# Patient Record
Sex: Female | Born: 1965 | Race: Black or African American | Hispanic: No | Smoking: Current every day smoker
Health system: Southern US, Community
[De-identification: ages and names within clinical notes are randomized; demographics above are authoritative.]

## PROBLEM LIST (undated history)

## (undated) DIAGNOSIS — I10 Essential (primary) hypertension: Secondary | ICD-10-CM

## (undated) DIAGNOSIS — M1990 Unspecified osteoarthritis, unspecified site: Secondary | ICD-10-CM

## (undated) DIAGNOSIS — E119 Type 2 diabetes mellitus without complications: Secondary | ICD-10-CM

## (undated) DIAGNOSIS — E785 Hyperlipidemia, unspecified: Secondary | ICD-10-CM

## (undated) HISTORY — DX: Hyperlipidemia, unspecified: E78.5

## (undated) HISTORY — DX: Type 2 diabetes mellitus without complications: E11.9

## (undated) HISTORY — DX: Unspecified osteoarthritis, unspecified site: M19.90

---

## 2006-06-16 ENCOUNTER — Emergency Department: Payer: Self-pay | Admitting: General Practice

## 2006-12-15 ENCOUNTER — Ambulatory Visit: Payer: Self-pay | Admitting: General Practice

## 2006-12-28 ENCOUNTER — Ambulatory Visit: Payer: Self-pay | Admitting: General Practice

## 2008-12-23 ENCOUNTER — Emergency Department: Payer: Self-pay | Admitting: Emergency Medicine

## 2011-10-22 ENCOUNTER — Emergency Department: Payer: Self-pay | Admitting: Emergency Medicine

## 2011-10-22 LAB — BASIC METABOLIC PANEL
Anion Gap: 9 (ref 7–16)
Calcium, Total: 9.6 mg/dL (ref 8.5–10.1)
Co2: 26 mmol/L (ref 21–32)
Creatinine: 1.1 mg/dL (ref 0.60–1.30)
EGFR (African American): >60
EGFR (Non-African Amer.): >60
Glucose: 108 mg/dL — ABNORMAL HIGH (ref 65–99)
Sodium: 140 mmol/L (ref 136–145)

## 2011-10-22 LAB — CBC
MCH: 29.3 pg (ref 26.0–34.0)
MCV: 87 fL (ref 80–100)
Platelet: 290 10*3/uL (ref 150–440)
RBC: 4.57 10*6/uL (ref 3.80–5.20)
RDW: 14.8 % — ABNORMAL HIGH (ref 11.5–14.5)
WBC: 7.3 10*3/uL (ref 3.6–11.0)

## 2011-10-22 LAB — URINALYSIS, COMPLETE
Bacteria: NONE SEEN
Specific Gravity: 1.022 (ref 1.003–1.030)
Squamous Epithelial: 11
WBC UR: 6 /HPF (ref 0–5)

## 2011-10-22 LAB — PREGNANCY, URINE: Pregnancy Test, Urine: NEGATIVE m[IU]/mL

## 2012-08-15 ENCOUNTER — Emergency Department: Payer: Self-pay | Admitting: Emergency Medicine

## 2012-08-15 LAB — BASIC METABOLIC PANEL
Anion Gap: 5 — ABNORMAL LOW (ref 7–16)
BUN: 12 mg/dL (ref 7–18)
Chloride: 102 mmol/L (ref 98–107)
Co2: 31 mmol/L (ref 21–32)
Creatinine: 0.95 mg/dL (ref 0.60–1.30)
EGFR (Non-African Amer.): >60

## 2012-08-15 LAB — CBC
HCT: 36.2 % (ref 35.0–47.0)
HGB: 12.1 g/dL (ref 12.0–16.0)
MCH: 28.4 pg (ref 26.0–34.0)
MCHC: 33.5 g/dL (ref 32.0–36.0)
MCV: 85 fL (ref 80–100)
Platelet: 297 10*3/uL (ref 150–440)
RBC: 4.27 10*6/uL (ref 3.80–5.20)
WBC: 10.3 10*3/uL (ref 3.6–11.0)

## 2012-08-15 LAB — TROPONIN I: Troponin-I: <0.02 ng/mL

## 2012-08-15 LAB — PRO B NATRIURETIC PEPTIDE: B-Type Natriuretic Peptide: 61 pg/mL (ref 0–125)

## 2013-08-27 ENCOUNTER — Ambulatory Visit: Payer: Self-pay | Admitting: Family Medicine

## 2016-12-03 ENCOUNTER — Emergency Department: Payer: Self-pay

## 2016-12-03 ENCOUNTER — Emergency Department
Admission: EM | Admit: 2016-12-03 | Discharge: 2016-12-03 | Disposition: A | Discharge status: Home / Self Care | Payer: Self-pay | Attending: Emergency Medicine | Admitting: Emergency Medicine

## 2016-12-03 DIAGNOSIS — J181 Lobar pneumonia, unspecified organism: Secondary | ICD-10-CM | POA: Insufficient documentation

## 2016-12-03 DIAGNOSIS — N390 Urinary tract infection, site not specified: Secondary | ICD-10-CM | POA: Insufficient documentation

## 2016-12-03 DIAGNOSIS — R319 Hematuria, unspecified: Secondary | ICD-10-CM | POA: Insufficient documentation

## 2016-12-03 DIAGNOSIS — F17210 Nicotine dependence, cigarettes, uncomplicated: Secondary | ICD-10-CM | POA: Insufficient documentation

## 2016-12-03 DIAGNOSIS — I10 Essential (primary) hypertension: Secondary | ICD-10-CM | POA: Insufficient documentation

## 2016-12-03 DIAGNOSIS — J189 Pneumonia, unspecified organism: Secondary | ICD-10-CM

## 2016-12-03 DIAGNOSIS — E876 Hypokalemia: Secondary | ICD-10-CM | POA: Insufficient documentation

## 2016-12-03 HISTORY — DX: Essential (primary) hypertension: I10

## 2016-12-03 LAB — COMPREHENSIVE METABOLIC PANEL
ALBUMIN: 2.9 g/dL — AB (ref 3.5–5.0)
ALT: 19 U/L (ref 14–54)
AST: 30 U/L (ref 15–41)
Alkaline Phosphatase: 137 U/L — ABNORMAL HIGH (ref 38–126)
Anion gap: 12 (ref 5–15)
BUN: 9 mg/dL (ref 6–20)
CHLORIDE: 96 mmol/L — AB (ref 101–111)
CO2: 23 mmol/L (ref 22–32)
Calcium: 8.6 mg/dL — ABNORMAL LOW (ref 8.9–10.3)
Creatinine, Ser: 1.07 mg/dL — ABNORMAL HIGH (ref 0.44–1.00)
GFR calc non Af Amer: >60 mL/min (ref >60)
GLUCOSE: 116 mg/dL — AB (ref 65–99)
Potassium: 2.7 mmol/L — CL (ref 3.5–5.1)
SODIUM: 131 mmol/L — AB (ref 135–145)
Total Bilirubin: 0.4 mg/dL (ref 0.3–1.2)
Total Protein: 7.2 g/dL (ref 6.5–8.1)

## 2016-12-03 LAB — CBC WITH DIFFERENTIAL/PLATELET
BASOS PCT: 1 %
Basophils Absolute: 0.1 10*3/uL (ref 0–0.1)
EOS ABS: 0 10*3/uL (ref 0–0.7)
Eosinophils Relative: 0 %
HEMATOCRIT: 31.7 % — AB (ref 35.0–47.0)
HEMOGLOBIN: 11 g/dL — AB (ref 12.0–16.0)
Lymphocytes Relative: 12 %
Lymphs Abs: 1.5 10*3/uL (ref 1.0–3.6)
MCH: 28.5 pg (ref 26.0–34.0)
MCHC: 34.8 g/dL (ref 32.0–36.0)
MCV: 82 fL (ref 80.0–100.0)
Monocytes Absolute: 0.6 10*3/uL (ref 0.2–0.9)
Monocytes Relative: 4 %
NEUTROS ABS: 10.5 10*3/uL — AB (ref 1.4–6.5)
NEUTROS PCT: 83 %
Platelets: 267 10*3/uL (ref 150–440)
RBC: 3.86 MIL/uL (ref 3.80–5.20)
RDW: 15.6 % — ABNORMAL HIGH (ref 11.5–14.5)
WBC: 12.7 10*3/uL — AB (ref 3.6–11.0)

## 2016-12-03 LAB — URINALYSIS, COMPLETE (UACMP) WITH MICROSCOPIC
BILIRUBIN URINE: NEGATIVE
Glucose, UA: NEGATIVE mg/dL
KETONES UR: NEGATIVE mg/dL
NITRITE: NEGATIVE
PROTEIN: 100 mg/dL — AB
Specific Gravity, Urine: 1.019 (ref 1.005–1.030)
pH: 5 (ref 5.0–8.0)

## 2016-12-03 LAB — LACTIC ACID, PLASMA: LACTIC ACID, VENOUS: 1.4 mmol/L (ref 0.5–1.9)

## 2016-12-03 LAB — LIPASE, BLOOD: Lipase: 25 U/L (ref 11–51)

## 2016-12-03 LAB — PREGNANCY, URINE: Preg Test, Ur: NEGATIVE

## 2016-12-03 MED ORDER — ACETAMINOPHEN 500 MG PO TABS
1000.0000 mg | ORAL_TABLET | Freq: Once | ORAL | Status: AC
  Administered 2016-12-03: 1000 mg via ORAL
  Filled 2016-12-03: qty 2

## 2016-12-03 MED ORDER — DEXTROSE 5 % IV SOLN
500.0000 mg | Freq: Once | INTRAVENOUS | Status: AC
  Administered 2016-12-03: 500 mg via INTRAVENOUS
  Filled 2016-12-03: qty 500

## 2016-12-03 MED ORDER — DEXTROSE 5 % IV SOLN
1.0000 g | Freq: Once | INTRAVENOUS | Status: AC
  Administered 2016-12-03: 1 g via INTRAVENOUS
  Filled 2016-12-03: qty 10

## 2016-12-03 MED ORDER — CEPHALEXIN 500 MG PO CAPS: 500.0000 mg | ORAL_CAPSULE | Freq: Three times a day (TID) | ORAL | 0 refills | Status: AC

## 2016-12-03 MED ORDER — SODIUM CHLORIDE 0.9 % IV BOLUS (SEPSIS)
1000.0000 mL | Freq: Once | INTRAVENOUS | Status: AC
  Administered 2016-12-03: 1000 mL via INTRAVENOUS

## 2016-12-03 MED ORDER — DOXYCYCLINE MONOHYDRATE 100 MG PO CAPS: 100.0000 mg | ORAL_CAPSULE | Freq: Two times a day (BID) | ORAL | 0 refills | Status: AC

## 2016-12-03 MED ORDER — POTASSIUM CHLORIDE CRYS ER 20 MEQ PO TBCR: 20.0000 meq | EXTENDED_RELEASE_TABLET | Freq: Two times a day (BID) | ORAL | 0 refills | Status: DC

## 2016-12-03 MED ORDER — POTASSIUM CHLORIDE 10 MEQ/100ML IV SOLN
10.0000 meq | Freq: Once | INTRAVENOUS | Status: AC
  Administered 2016-12-03: 10 meq via INTRAVENOUS
  Filled 2016-12-03: qty 100

## 2016-12-03 MED ORDER — POTASSIUM CHLORIDE 20 MEQ PO PACK
40.0000 meq | PACK | ORAL | Status: AC
  Administered 2016-12-03: 40 meq via ORAL
  Filled 2016-12-03: qty 2

## 2016-12-03 NOTE — Discharge Instructions (Signed)
as we discussed, it appears that your fever is due to a pneumonia as well as possibly a urinary tract infection.  I provided prescriptions for 2 different antibiotics.  Please take the full course of both medications and do not stop taking them early, even if you are feeling better.  Additionally I provided you with a prescription for a potassium supplement because your potassium level was low today.  Read through the included information, drink plenty of fluids, and return to the emergency department if he develop new or worsening symptoms.

## 2016-12-03 NOTE — ED Triage Notes (Signed)
Pt came to ED via EMS from home c/o headache for 1 wee Fever of 1031 on arrival Pt is hypertensive HR 107

## 2016-12-03 NOTE — ED Provider Notes (Signed)
[facility] Emergency Department Provider Note  ____________________________________________   First MD Initiated Contact with Patient 12/03/16 1306     (approximate)  I have reviewed the triage vital signs and the nursing notes.   HISTORY  Chief Complaint Headache and Fever    HPI Vanessa Wyatt is a 51 y.o. female with a history only of hypertension and obesity who presents for evaluation of fever and headache.  She states that for about a week she has had a cough, runny nose, and other respiratory/viral symptoms.  She states it got worse yesterday evening and became severe.  Starting this morning she was having fever and chills with worsening cough.  She denies chest pain except some pain with the cough.  She denies shortness of breath, nausea, vomiting, abdominal pain.  Nothing in particular makes the patient's symptoms better nor worse.    She denies neck pain and neck stiffness and has not had any diarrhea or constipation.   Past Medical History:  Diagnosis Date  . Hypertension     There are no active problems to display for this patient.   History reviewed. No pertinent surgical history.  Prior to Admission medications   Medication Sig Start Date End Date Taking? Authorizing Provider  cephALEXin (KEFLEX) 500 MG capsule Take 1 capsule (500 mg total) by mouth 3 (three) times daily. 12/03/16 12/13/16  Loleta RoseForbach, Janae Bonser, MD  doxycycline (MONODOX) 100 MG capsule Take 1 capsule (100 mg total) by mouth 2 (two) times daily. 12/03/16 12/13/16  Loleta RoseForbach, Lakisha Peyser, MD  potassium chloride SA (KLOR-CON M20) 20 MEQ tablet Take 1 tablet (20 mEq total) by mouth 2 (two) times daily. 12/03/16   Loleta RoseForbach, Dryden Tapley, MD    Allergies Patient has no known allergies.  No family history on file.  Social History Social History  Substance Use Topics  . Smoking status: Current Every Day Smoker    Types: Cigarettes  . Smokeless tobacco: Never Used  . Alcohol use No    Review of  Systems Constitutional: No fever/chills Eyes: No visual changes. ENT: No sore throat. Cardiovascular: Denies chest pain. Respiratory: Denies shortness of breath. Gastrointestinal: No abdominal pain.  No nausea, no vomiting.  No diarrhea.  No constipation. Genitourinary: Negative for dysuria. Musculoskeletal: Negative for neck pain.  Negative for back pain. Integumentary: Negative for rash. Neurological: Negative for headaches, focal weakness or numbness.   ____________________________________________   PHYSICAL EXAM:  VITAL SIGNS: ED Triage Vitals  Enc Vitals Group     BP 12/03/16 1231 (!) 178/101     Pulse Rate 12/03/16 1231 (!) 110     Resp 12/03/16 1231 (!) 22     Temp 12/03/16 1231 (!) 103 F (39.4 C)     Temp Source 12/03/16 1231 Oral     SpO2 12/03/16 1231 94 %     Weight 12/03/16 1232 98.4 kg (217 lb)     Height 12/03/16 1232 1.524 m (5')     Head Circumference --      Peak Flow --      Pain Score --      Pain Loc --      Pain Edu? --      Excl. in GC? --     Constitutional: Alert and oriented. appears uncomfortable but nontoxic Eyes: Conjunctivae are normal.  Head: Atraumatic. Nose: No congestion/rhinnorhea. Mouth/Throat: Mucous membranes are moist. Neck: No stridor.  No meningeal signs.   Cardiovascular: mild tachycardia, regular rhythm. Good peripheral circulation. Grossly normal heart sounds.  Respiratory: Normal respiratory effort.  No retractions. scattered crackles in right upper lobe Gastrointestinal: Obese. Soft and nontender. No distention.  Musculoskeletal: No lower extremity tenderness nor edema. No gross deformities of extremities. Neurologic:  Normal speech and language. No gross focal neurologic deficits are appreciated.  Skin:  Skin is warm, dry and intact. No rash noted. Psychiatric: Mood and affect are normal. Speech and behavior are normal.  ____________________________________________   LABS (all labs ordered are listed, but only  abnormal results are displayed)  Labs Reviewed  CBC WITH DIFFERENTIAL/PLATELET - Abnormal; Notable for the following:       Result Value   WBC 12.7 (*)    Hemoglobin 11.0 (*)    HCT 31.7 (*)    RDW 15.6 (*)    Neutro Abs 10.5 (*)    All other components within normal limits  URINALYSIS, COMPLETE (UACMP) WITH MICROSCOPIC - Abnormal; Notable for the following:    Color, Urine AMBER (*)    APPearance CLOUDY (*)    Hgb urine dipstick LARGE (*)    Protein, ur 100 (*)    Leukocytes, UA TRACE (*)    Bacteria, UA MANY (*)    Squamous Epithelial / LPF TOO NUMEROUS TO COUNT (*)    All other components within normal limits  COMPREHENSIVE METABOLIC PANEL - Abnormal; Notable for the following:    Sodium 131 (*)    Potassium 2.7 (*)    Chloride 96 (*)    Glucose, Bld 116 (*)    Creatinine, Ser 1.07 (*)    Calcium 8.6 (*)    Albumin 2.9 (*)    Alkaline Phosphatase 137 (*)    All other components within normal limits  CULTURE, BLOOD (ROUTINE X 2)  CULTURE, BLOOD (ROUTINE X 2)  URINE CULTURE  LACTIC ACID, PLASMA  LIPASE, BLOOD  PREGNANCY, URINE   ____________________________________________  EKG  ED ECG REPORT I, Cordell Coke, the attending physician, personally viewed and interpreted this ECG.  Date: 12/03/2016 EKG Time: 12:40 Rate: 107 Rhythm: mild sinus tachycardia QRS Axis: normal Intervals: normal ST/T Wave abnormalities: Non-specific ST segment / T-wave changes, but no evidence of acute ischemia. Narrative Interpretation: no evidence of acute ischemia   ED ECG REPORT I, Licet Dunphy, the attending physician, personally viewed and interpreted this ECG.  Date: 12/03/2016 EKG Time: 12:43 PM Rate: 108 Rhythm: Mild sinus tachycardia QRS Axis: normal Intervals: normal ST/T Wave abnormalities: Non-specific ST segment / T-wave changes, but no evidence of acute ischemia.\ Narrative Interpretation: no evidence of acute  ischemia    ____________________________________________  RADIOLOGY   Dg Chest Port 1 View  Result Date: 12/03/2016 CLINICAL DATA:  Sepsis EXAM: PORTABLE CHEST 1 VIEW COMPARISON:  08/15/2012 FINDINGS: Right upper lobe infiltrate with air bronchograms consistent with pneumonia. Left lung is clear. Negative for effusion or heart failure IMPRESSION: Right upper lobe infiltrate compatible with pneumonia Electronically Signed   By: Marlan Palau M.D.   On: 12/03/2016 13:04    ____________________________________________   PROCEDURES  Critical Care performed: No   Procedure(s) performed:   Procedures   ____________________________________________   INITIAL IMPRESSION / ASSESSMENT AND PLAN / ED COURSE  Pertinent labs & imaging results that were available during my care of the patient were reviewed by me and considered in my medical decision making (see chart for details).  although technically the patient does meet criteria for sepsis, we immediately identified the source as a right upper lobar pneumonia.  Her lactate is within normal limits and she has  only a very mild leukocytosis of 12.7.  Most notable on her labs is a potassium of 2.7 and she denies any recent vomiting or diarrhea but has not been eating and drinking as much usual.  Her urinalysis is significant for trace leukocytes but too numerous to count RBCs and squamous epithelials as well as bacteria.  however she is having no flank pain and no dysuria. I discussed with her inpatient versus outpatient treatment and she prefers not to stay in the hospital.  Given that her vital signs are stable and that we can treat her with antibiotics I think that is appropriate.  I gave her ceftriaxone 1 g IV and azithromycin 500 mg IV in the ED.  To treat both a probable urinary tract infection and a pneumonia, I will prescribed Keflex as well as doxycycline.  I am also getting her prescription for potassium supplements and I gave her 40 mEq  by mouth and 10 mEq by IV while in the emergency department.  I gave her strict return precautions of her symptoms do not improve.  She understands and agrees with the plan.   Clinical Course as of Dec 03 1548  Fri Dec 03, 2016  1523 reassess the patient is now and she reaffirmed that she prefers to go home and is "missing my bed".  We will move towards discharged with the plan as described above.  [CF]    Clinical Course User Index [CF] Loleta Rose, MD    ____________________________________________  FINAL CLINICAL IMPRESSION(S) / ED DIAGNOSES  Final diagnoses:  Pneumonia of right upper lobe due to infectious organism [facility])  Urinary tract infection with hematuria, site unspecified  Hypokalemia     MEDICATIONS GIVEN DURING THIS VISIT:  Medications  acetaminophen (TYLENOL) tablet 1,000 mg (not administered)  sodium chloride 0.9 % bolus 1,000 mL (0 mLs Intravenous Stopped 12/03/16 1450)  cefTRIAXone (ROCEPHIN) 1 g in dextrose 5 % 50 mL IVPB (0 g Intravenous Stopped 12/03/16 1450)  azithromycin (ZITHROMAX) 500 mg in dextrose 5 % 250 mL IVPB (500 mg Intravenous New Bag/Given 12/03/16 1346)  potassium chloride (KLOR-CON) packet 40 mEq (40 mEq Oral Given 12/03/16 1448)  potassium chloride 10 mEq in 100 mL IVPB (10 mEq Intravenous New Bag/Given 12/03/16 1448)     NEW OUTPATIENT MEDICATIONS STARTED DURING THIS VISIT:  New Prescriptions   CEPHALEXIN (KEFLEX) 500 MG CAPSULE    Take 1 capsule (500 mg total) by mouth 3 (three) times daily.   DOXYCYCLINE (MONODOX) 100 MG CAPSULE    Take 1 capsule (100 mg total) by mouth 2 (two) times daily.   POTASSIUM CHLORIDE SA (KLOR-CON M20) 20 MEQ TABLET    Take 1 tablet (20 mEq total) by mouth 2 (two) times daily.    Modified Medications   No medications on file    Discontinued Medications   No medications on file     Note:  This document was prepared using Dragon voice recognition software and may include unintentional dictation errors.     Loleta Rose, MD 12/03/16 1550

## 2016-12-04 LAB — URINE CULTURE

## 2016-12-08 LAB — CULTURE, BLOOD (ROUTINE X 2)
CULTURE: NO GROWTH
Culture: NO GROWTH
SPECIAL REQUESTS: ADEQUATE
SPECIAL REQUESTS: ADEQUATE

## 2018-05-23 ENCOUNTER — Encounter: Payer: Self-pay | Admitting: Emergency Medicine

## 2018-05-23 ENCOUNTER — Other Ambulatory Visit: Payer: Self-pay

## 2018-05-23 ENCOUNTER — Emergency Department
Admission: EM | Admit: 2018-05-23 | Discharge: 2018-05-23 | Disposition: A | Discharge status: Home / Self Care | Payer: Self-pay | Attending: Student in an Organized Health Care Education/Training Program | Admitting: Student in an Organized Health Care Education/Training Program

## 2018-05-23 DIAGNOSIS — I10 Essential (primary) hypertension: Secondary | ICD-10-CM | POA: Insufficient documentation

## 2018-05-23 DIAGNOSIS — B349 Viral infection, unspecified: Secondary | ICD-10-CM | POA: Insufficient documentation

## 2018-05-23 DIAGNOSIS — F17210 Nicotine dependence, cigarettes, uncomplicated: Secondary | ICD-10-CM | POA: Insufficient documentation

## 2018-05-23 DIAGNOSIS — Z79899 Other long term (current) drug therapy: Secondary | ICD-10-CM | POA: Insufficient documentation

## 2018-05-23 MED ORDER — GUAIFENESIN-CODEINE 100-10 MG/5ML PO SOLN: 5.0000 mL | ORAL | 0 refills | Status: DC | PRN

## 2018-05-23 NOTE — Discharge Instructions (Addendum)
Follow-up with your primary care provider or can no clinic acute care if any continued problems.  Begin drinking clear liquids frequently to avoid dehydration.  Avoid solid food for the next 24 hours including dairy products which will cause your diarrhea to possibly be worse.  You may take Tylenol or ibuprofen as needed for headache or body aches.  Robitussin-AC contains a narcotic and should not be taken while driving or operating machinery.  This medication is for cough and congestion.  Take this medication only as directed.

## 2018-05-23 NOTE — ED Triage Notes (Signed)
Pt states headache and congestion started yesterday, NAD.

## 2018-05-23 NOTE — ED Provider Notes (Signed)
[facility] Emergency Department Provider Note   ____________________________________________   First MD Initiated Contact with Patient 05/23/18 1017     (approximate)  I have reviewed the triage vital signs and the nursing notes.   HISTORY  Chief Complaint Headache and Nasal Congestion   HPI Vanessa Wyatt is a 53 y.o. female presents to the ED with complaint of cough, congestion, headache and subjective fever for the last 4 days.  Patient also reports diarrhea x2 but no vomiting.  Patient states she has not taken any over-the-counter medication today.  She rates her pain as an 8 out of 10.    Past Medical History:  Diagnosis Date  . Hypertension     There are no active problems to display for this patient.   History reviewed. No pertinent surgical history.  Prior to Admission medications   Medication Sig Start Date End Date Taking? Authorizing Provider  guaiFENesin-codeine 100-10 MG/5ML syrup Take 5 mLs by mouth every 4 (four) hours as needed for cough. 05/23/18   Tommi Rumps, PA-C  potassium chloride SA (KLOR-CON M20) 20 MEQ tablet Take 1 tablet (20 mEq total) by mouth 2 (two) times daily. 12/03/16   Loleta Rose, MD    Allergies Patient has no known allergies.  No family history on file.  Social History Social History   Tobacco Use  . Smoking status: Current Every Day Smoker    Types: Cigarettes  . Smokeless tobacco: Never Used  Substance Use Topics  . Alcohol use: No  . Drug use: No    Review of Systems Constitutional: No fever/chills Eyes: No visual changes. ENT: Mild sore throat.  Positive nasal congestion.  Positive for ear pain. Cardiovascular: Denies chest pain. Respiratory: Denies shortness of breath.  Positive for nonproductive cough. Gastrointestinal: No abdominal pain.  No nausea, no vomiting.  Positive diarrhea. Genitourinary: Negative for dysuria. Musculoskeletal: Negative for muscle aches. Skin: Negative for  rash. Neurological: Negative for headaches, focal weakness or numbness. ___________________________________________   PHYSICAL EXAM:  VITAL SIGNS: ED Triage Vitals  Enc Vitals Group     BP 05/23/18 1010 (!) 165/94     Pulse Rate 05/23/18 1010 84     Resp 05/23/18 1010 18     Temp 05/23/18 1010 98.8 F (37.1 C)     Temp Source 05/23/18 1010 Oral     SpO2 05/23/18 1010 95 %     Weight 05/23/18 1008 217 lb (98.4 kg)     Height 05/23/18 1008 5\' 2"  (1.575 m)     Head Circumference --      Peak Flow --      Pain Score 05/23/18 1006 8     Pain Loc --      Pain Edu? --      Excl. in GC? --     Constitutional: Alert and oriented. Well appearing and in no acute distress. Eyes: Conjunctivae are normal.  Head: Atraumatic. Nose: Mild congestion/rhinnorhea.  TMs are dull but no erythema or injection is noted. Mouth/Throat: Mucous membranes are moist.  Oropharynx non-erythematous.  Positive posterior drainage. Neck: No stridor.   Hematological/Lymphatic/Immunilogical: No cervical lymphadenopathy. Cardiovascular: Normal rate, regular rhythm. Grossly normal heart sounds.  Good peripheral circulation. Respiratory: Normal respiratory effort.  No retractions. Lungs CTAB. Gastrointestinal: Soft and nontender. No distention.  Musculoskeletal: Moves upper and lower extremities without any difficulty normal gait was noted. Neurologic:  Normal speech and language. No gross focal neurologic deficits are appreciated.  Skin:  Skin is  warm, dry and intact. No rash noted. Psychiatric: Mood and affect are normal. Speech and behavior are normal.  ____________________________________________   LABS (all labs ordered are listed, but only abnormal results are displayed)  Labs Reviewed - No data to display  PROCEDURES  Procedure(s) performed (including Critical Care):  Procedures   ____________________________________________   INITIAL IMPRESSION / ASSESSMENT AND PLAN / ED COURSE  As part of  my medical decision making, I reviewed the following data within the electronic MEDICAL RECORD NUMBER Notes from prior ED visits and [place] Controlled Substance Database  53 year old female presents to the ED with 4-day history of cough, congestion, headache and subjective fever.  She has not taken any over-the-counter medication for her cough.  She also reports diarrhea x2 today.  Physical exam is consistent with a viral illness.  Patient was made aware.  She is to remain on clear liquids today.  She was given a prescription for Robitussin-AC as needed for cough and congestion.  She was given a note to remain out of work for the next 2 days.  She is to follow-up with her PCP if any continued problems. ____________________________________________   FINAL CLINICAL IMPRESSION(S) / ED DIAGNOSES  Final diagnoses:  Viral illness     ED Discharge Orders         Ordered    guaiFENesin-codeine 100-10 MG/5ML syrup  Every 4 hours PRN,   Status:  Discontinued     05/23/18 1048    guaiFENesin-codeine 100-10 MG/5ML syrup  Every 4 hours PRN     05/23/18 1049           Note:  This document was prepared using Dragon voice recognition software and may include unintentional dictation errors.    Tommi Rumps, PA-C 05/23/18 1104    Willy Eddy, MD 05/23/18 [phone]

## 2018-05-29 ENCOUNTER — Other Ambulatory Visit: Payer: Self-pay

## 2018-05-29 ENCOUNTER — Emergency Department
Admission: EM | Admit: 2018-05-29 | Discharge: 2018-05-29 | Disposition: A | Discharge status: Home / Self Care | Payer: Self-pay | Attending: Emergency Medicine | Admitting: Emergency Medicine

## 2018-05-29 DIAGNOSIS — R040 Epistaxis: Secondary | ICD-10-CM | POA: Insufficient documentation

## 2018-05-29 DIAGNOSIS — F17210 Nicotine dependence, cigarettes, uncomplicated: Secondary | ICD-10-CM | POA: Insufficient documentation

## 2018-05-29 DIAGNOSIS — I10 Essential (primary) hypertension: Secondary | ICD-10-CM | POA: Insufficient documentation

## 2018-05-29 DIAGNOSIS — Z79899 Other long term (current) drug therapy: Secondary | ICD-10-CM | POA: Insufficient documentation

## 2018-05-29 MED ORDER — TRANEXAMIC ACID 1000 MG/10ML IV SOLN
500.0000 mg | Freq: Once | INTRAVENOUS | Status: DC
  Filled 2018-05-29: qty 10

## 2018-05-29 MED ORDER — OXYMETAZOLINE HCL 0.05 % NA SOLN
1.0000 | Freq: Once | NASAL | Status: DC
  Filled 2018-05-29: qty 30

## 2018-05-29 MED ORDER — CLONIDINE HCL 0.1 MG PO TABS
0.1000 mg | ORAL_TABLET | Freq: Once | ORAL | Status: AC
  Administered 2018-05-29: 0.1 mg via ORAL
  Filled 2018-05-29: qty 1

## 2018-05-29 NOTE — ED Provider Notes (Signed)
[facility] Emergency Department Provider Note  ____________________________________________  Time seen: Approximately 8:03 AM  I have reviewed the triage vital signs and the nursing notes.   HISTORY  Chief Complaint No chief complaint on file.   HPI Vanessa Wyatt is a 53 y.o. female with a history of hypertension who presents for evaluation of epistaxis.  Her symptoms started this morning.  She is complaining of nosebleed coming from the right nare, constant for a few hours.  She has tried applying pressure at home unsuccessfully.  She denies any prior history of epistaxis.  She is not on blood thinners.  Patient reports that she has not been on her antihypertensive medications for several years since losing her insurance and not being able to follow-up with her primary care doctor.  Past Medical History:  Diagnosis Date  . Hypertension     Prior to Admission medications   Medication Sig Start Date End Date Taking? Authorizing Provider  guaiFENesin-codeine 100-10 MG/5ML syrup Take 5 mLs by mouth every 4 (four) hours as needed for cough. 05/23/18   Tommi Rumps, PA-C  potassium chloride SA (KLOR-CON M20) 20 MEQ tablet Take 1 tablet (20 mEq total) by mouth 2 (two) times daily. 12/03/16   Loleta Rose, MD    Allergies Patient has no known allergies.  No family history on file.  Social History Social History   Tobacco Use  . Smoking status: Current Every Day Smoker    Types: Cigarettes  . Smokeless tobacco: Never Used  Substance Use Topics  . Alcohol use: No  . Drug use: No    Review of Systems  Constitutional: Negative for fever. Eyes: Negative for visual changes. ENT: Negative for sore throat. + nosebleed Neck: No neck pain  Cardiovascular: Negative for chest pain. Respiratory: Negative for shortness of breath. Gastrointestinal: Negative for abdominal pain, vomiting or diarrhea. Genitourinary: Negative for dysuria. Musculoskeletal:  Negative for back pain. Skin: Negative for rash. Neurological: Negative for headaches, weakness or numbness. Psych: No SI or HI  ____________________________________________   PHYSICAL EXAM:  VITAL SIGNS: ED Triage Vitals  Enc Vitals Group     BP 05/29/18 0645 (!) 199/106     Pulse Rate 05/29/18 0645 78     Resp 05/29/18 0645 18     Temp 05/29/18 0645 98.5 F (36.9 C)     Temp Source 05/29/18 0645 Axillary     SpO2 05/29/18 0645 99 %     Weight 05/29/18 0643 217 lb (98.4 kg)     Height 05/29/18 0643 5\' 2"  (1.575 m)     Head Circumference --      Peak Flow --      Pain Score 05/29/18 0643 0     Pain Loc --      Pain Edu? --      Excl. in GC? --     Constitutional: Alert and oriented. Well appearing and in no apparent distress. HEENT:      Head: Normocephalic and atraumatic.         Eyes: Conjunctivae are normal. Sclera is non-icteric.      Nose: epistaxis from R nare, no obvious lacerations, abrasion, or source of bleeding identified on exam      Mouth/Throat: Mucous membranes are moist.       Neck: Supple with no signs of meningismus. Cardiovascular: Regular rate and rhythm. No murmurs, gallops, or rubs. 2+ symmetrical distal pulses are present in all extremities. No JVD. Respiratory: Normal respiratory effort.  Lungs are clear to auscultation bilaterally. No wheezes, crackles, or rhonchi.  Neurologic: Normal speech and language. Face is symmetric. Moving all extremities. No gross focal neurologic deficits are appreciated. Skin: Skin is warm, dry and intact. No rash noted. Psychiatric: Mood and affect are normal. Speech and behavior are normal.  ____________________________________________   LABS (all labs ordered are listed, but only abnormal results are displayed)  Labs Reviewed - No data to display ____________________________________________  EKG  none  ____________________________________________  RADIOLOGY  none    ____________________________________________   PROCEDURES  Procedure(s) performed: yes Procedures   Epistaxis Management Date/Time: 05/29/2018 at 8:05 AM Performed by: Nita Sickle Authorized by: Nita Sickle Consent: Verbal consent obtained. Written consent not obtained. Risks and benefits: risks, benefits and alternatives were discussed Consent given by: patient Required items: required blood products, implants, devices, and special equipment available Patient sedated: no Repair method: Afrin and pressure for 20 min Post-procedure assessment: bleeding stopped after 20 minutes of pressure Treatment complexity: simple Patient tolerance: Patient tolerated the procedure well with no immediate complications   Critical Care performed:  None ____________________________________________   INITIAL IMPRESSION / ASSESSMENT AND PLAN / ED COURSE   53 y.o. female with a history of hypertension who presents for evaluation of epistaxis.  Upon arrival to the emergency room patient was extremely hypertensive with systolics in the low 200s.  She was given clonidine.  I had patient evacuate all the clots in her nose and then Afrin was applied followed by 20 to 30 minutes of constant pressure.  At that time epistaxis had resolved.  I evaluated patient's nose and did not see an abrasion or laceration or any other source of bleeding.  Patient was monitored for 30 minutes with no recurrence of her epistaxis.  Her blood pressure significantly improved in the emergency room.  Will discharge home on clonidine and follow-up with primary care doctor.      As part of my medical decision making, I reviewed the following data within the electronic MEDICAL RECORD NUMBER Nursing notes reviewed and incorporated, Old chart reviewed, Notes from prior ED visits and [place] Controlled Substance Database    Pertinent labs & imaging results that were available during my care of the patient were reviewed by me and  considered in my medical decision making (see chart for details).    ____________________________________________   FINAL CLINICAL IMPRESSION(S) / ED DIAGNOSES  Final diagnoses:  Epistaxis  Essential hypertension      NEW MEDICATIONS STARTED DURING THIS VISIT:  ED Discharge Orders    None       Note:  This document was prepared using Dragon voice recognition software and may include unintentional dictation errors.    Nita Sickle, MD 05/29/18 [phone]

## 2018-05-29 NOTE — ED Triage Notes (Signed)
Pt with right sided nare epistaxis. Pt is pinching bridge. Pt states began around 0600.

## 2018-05-29 NOTE — ED Notes (Signed)
Clip to nose, no visible bleeding and states does not feel dripping down throat.

## 2018-05-31 ENCOUNTER — Emergency Department
Admission: EM | Admit: 2018-05-31 | Discharge: 2018-06-01 | Disposition: A | Discharge status: Home / Self Care | Payer: Self-pay | Attending: Emergency Medicine | Admitting: Emergency Medicine

## 2018-05-31 ENCOUNTER — Other Ambulatory Visit: Payer: Self-pay

## 2018-05-31 ENCOUNTER — Encounter: Payer: Self-pay | Admitting: *Deleted

## 2018-05-31 DIAGNOSIS — R040 Epistaxis: Secondary | ICD-10-CM

## 2018-05-31 DIAGNOSIS — Z79899 Other long term (current) drug therapy: Secondary | ICD-10-CM | POA: Insufficient documentation

## 2018-05-31 DIAGNOSIS — F17210 Nicotine dependence, cigarettes, uncomplicated: Secondary | ICD-10-CM | POA: Insufficient documentation

## 2018-05-31 DIAGNOSIS — I10 Essential (primary) hypertension: Secondary | ICD-10-CM

## 2018-05-31 MED ORDER — OXYMETAZOLINE HCL 0.05 % NA SOLN
1.0000 | Freq: Once | NASAL | Status: AC
  Administered 2018-05-31: 1 via NASAL
  Filled 2018-05-31: qty 30

## 2018-05-31 MED ORDER — OXYMETAZOLINE HCL 0.05 % NA SOLN: 1.0000 | Freq: Once | NASAL | Status: DC

## 2018-05-31 MED ORDER — CLONIDINE HCL 0.1 MG PO TABS
0.1000 mg | ORAL_TABLET | Freq: Once | ORAL | Status: AC
  Administered 2018-06-01: 0.1 mg via ORAL
  Filled 2018-05-31: qty 1

## 2018-05-31 MED ORDER — TRANEXAMIC ACID 1000 MG/10ML IV SOLN
500.0000 mg | Freq: Once | INTRAVENOUS | Status: DC
  Filled 2018-05-31: qty 10

## 2018-05-31 NOTE — ED Triage Notes (Signed)
Pt was seen here on Monday with epistaxis and sent home. Tonight around 6pm it started bleeding again and she used the Afrin they sent home with her and it worked initially but then started right back. Pt states she has had HTN her whole life and was taking medication that was recently stopped.Pt is A&O x 4 and in no acute distress

## 2018-06-01 MED ORDER — CLONIDINE HCL 0.1 MG PO TABS: 0.1000 mg | ORAL_TABLET | Freq: Two times a day (BID) | ORAL | 1 refills | Status: DC

## 2018-06-01 NOTE — ED Notes (Signed)
Patient up to use restroom 

## 2018-06-01 NOTE — ED Provider Notes (Signed)
[facility] Emergency Department Provider Note  ____________________________________________  Time seen: Approximately 12:03 AM  I have reviewed the triage vital signs and the nursing notes.   HISTORY  Chief Complaint Epistaxis   HPI Vanessa Wyatt is a 53 y.o. female who presents for evaluation of epistaxis. Seen here 2 days ago for the same. Patient was in the shower and epistaxis recurred on the R side only. She tried afrin at home with no relief. Has not been taking her antihypertensive medications. Not on blood thinners.   Past Medical History:  Diagnosis Date  . Hypertension     Prior to Admission medications   Medication Sig Start Date End Date Taking? Authorizing Provider  cloNIDine (CATAPRES) 0.1 MG tablet Take 1 tablet (0.1 mg total) by mouth 2 (two) times daily. 06/01/18 06/01/19  Nita Sickle, MD  guaiFENesin-codeine 100-10 MG/5ML syrup Take 5 mLs by mouth every 4 (four) hours as needed for cough. 05/23/18   Tommi Rumps, PA-C  potassium chloride SA (KLOR-CON M20) 20 MEQ tablet Take 1 tablet (20 mEq total) by mouth 2 (two) times daily. 12/03/16   Loleta Rose, MD    Allergies Patient has no known allergies.  No family history on file.  Social History Social History   Tobacco Use  . Smoking status: Current Every Day Smoker    Types: Cigarettes  . Smokeless tobacco: Never Used  Substance Use Topics  . Alcohol use: No  . Drug use: No    Review of Systems  Constitutional: Negative for fever. Eyes: Negative for visual changes. ENT: Negative for sore throat. + epitsaxis Neck: No neck pain  Cardiovascular: Negative for chest pain. Respiratory: Negative for shortness of breath. Gastrointestinal: Negative for abdominal pain, vomiting or diarrhea. Genitourinary: Negative for dysuria. Musculoskeletal: Negative for back pain. Skin: Negative for rash. Neurological: Negative for headaches, weakness or numbness. Psych: No SI or  HI  ____________________________________________   PHYSICAL EXAM:  VITAL SIGNS: ED Triage Vitals  Enc Vitals Group     BP 05/31/18 2347 (!) 205/107     Pulse --      Resp 05/31/18 2347 18     Temp 05/31/18 2347 98.4 F (36.9 C)     Temp Source 05/31/18 2347 Oral     SpO2 --      Weight 05/31/18 2348 218 lb 4.1 oz (99 kg)     Height 05/31/18 2348 5\' 2"  (1.575 m)     Head Circumference --      Peak Flow --      Pain Score 05/31/18 2348 0     Pain Loc --      Pain Edu? --      Excl. in [place]? --     Constitutional: Alert and oriented. Well appearing and in no apparent distress. HEENT:      Head: Normocephalic and atraumatic.         Eyes: Conjunctivae are normal. Sclera is non-icteric.       Nose: R nare epistaxis with no laceration or abrasion seen on exam      Mouth/Throat: Mucous membranes are moist.       Neck: Supple with no signs of meningismus. Cardiovascular: Regular rate and rhythm. No murmurs, gallops, or rubs. 2+ symmetrical distal pulses are present in all extremities. No JVD. Respiratory: Normal respiratory effort. Lungs are clear to auscultation bilaterally. No wheezes, crackles, or rhonchi.  Musculoskeletal: Nontender with normal range of motion in all extremities. No edema, cyanosis,  or erythema of extremities. Neurologic: Normal speech and language. Face is symmetric. Moving all extremities. No gross focal neurologic deficits are appreciated. Skin: Skin is warm, dry and intact. No rash noted. Psychiatric: Mood and affect are normal. Speech and behavior are normal.  ____________________________________________   LABS (all labs ordered are listed, but only abnormal results are displayed)  Labs Reviewed - No data to display ____________________________________________  EKG  none  ____________________________________________  RADIOLOGY  none  ____________________________________________   PROCEDURES  Procedure(s) performed: yes Procedures    Epistaxis Management  Date/Time: 06/01/2018 at 12:07 AM Performed by: Nita Sickle Authorized by: Nita Sickle Consent: Verbal consent obtained. Written consent not obtained. Risks and benefits: risks, benefits and alternatives were discussed Consent given by: patient Required items: required blood products, implants, devices, and special equipment available Patient sedated: no Repair method:clot evacuation and afrin followed by 20 min of pressure Post-procedure assessment: bleeding stopped after 20 minutes with constant pressure Treatment complexity: simple Patient tolerance: Patient tolerated the procedure well with no immediate complications   Critical Care performed:  None ____________________________________________   INITIAL IMPRESSION / ASSESSMENT AND PLAN / ED COURSE  53 y.o. female who presents for evaluation of epistaxis.  Patient arrives with right nare epistaxis.  Clots were evacuated and Afrin was applied.  Pressure for 20 minutes was applied with resolution of bleeding.  Patient was monitored for 40 minutes with no recurrence of her bleeding.  Evaluation after bleeding had stopped to show no evidence of laceration or abrasion.  Discussed the importance of taking her antihypertensive medications to prevent epistaxis from recurring.  Patient was given a prescription for clonidine twice daily. Discussed follow-up with primary care doctor.      As part of my medical decision making, I reviewed the following data within the electronic MEDICAL RECORD NUMBER Nursing notes reviewed and incorporated, Old chart reviewed, Notes from prior ED visits and [place] Controlled Substance Database    Pertinent labs & imaging results that were available during my care of the patient were reviewed by me and considered in my medical decision making (see chart for details).    ____________________________________________   FINAL CLINICAL IMPRESSION(S) / ED DIAGNOSES  Final  diagnoses:  Epistaxis  Essential hypertension      NEW MEDICATIONS STARTED DURING THIS VISIT:  ED Discharge Orders         Ordered    cloNIDine (CATAPRES) 0.1 MG tablet  2 times daily     06/01/18 0148           Note:  This document was prepared using Dragon voice recognition software and may include unintentional dictation errors.    Don Perking, [place], MD 06/01/18 [phone]

## 2019-08-19 ENCOUNTER — Emergency Department: Payer: Self-pay

## 2019-08-19 ENCOUNTER — Emergency Department
Admission: EM | Admit: 2019-08-19 | Discharge: 2019-08-19 | Disposition: A | Discharge status: Home / Self Care | Payer: Self-pay | Attending: Emergency Medicine | Admitting: Emergency Medicine

## 2019-08-19 ENCOUNTER — Encounter: Payer: Self-pay | Admitting: Emergency Medicine

## 2019-08-19 ENCOUNTER — Other Ambulatory Visit: Payer: Self-pay

## 2019-08-19 DIAGNOSIS — M25572 Pain in left ankle and joints of left foot: Secondary | ICD-10-CM | POA: Insufficient documentation

## 2019-08-19 MED ORDER — KETOROLAC TROMETHAMINE 30 MG/ML IJ SOLN
30.0000 mg | Freq: Once | INTRAMUSCULAR | Status: AC
  Administered 2019-08-19: 30 mg via INTRAMUSCULAR
  Filled 2019-08-19: qty 1

## 2019-08-19 MED ORDER — IBUPROFEN 600 MG PO TABS: 600.0000 mg | ORAL_TABLET | Freq: Four times a day (QID) | ORAL | 0 refills | Status: DC | PRN

## 2019-08-19 NOTE — ED Provider Notes (Signed)
[facility] Emergency Department Provider Note  ____________________________________________  Time seen: Approximately 1:47 PM  I have reviewed the triage vital signs and the nursing notes.   HISTORY  Chief Complaint Ankle Pain    HPI Vanessa Wyatt is a 54 y.o. female that presents to the emergency department for evaluation of left lateral ankle pain since yesterday.  Patient was at the park yesterday when pain started.  She was on her feet longer than usual yesterday.  Pain is to the outside of her ankle.  She is unsure if she might have tweaked her ankle.  No specific trauma.  No calf pain, leg pain, foot pain.   Past Medical History:  Diagnosis Date  . Hypertension     There are no problems to display for this patient.   History reviewed. No pertinent surgical history.  Prior to Admission medications   Medication Sig Start Date End Date Taking? Authorizing Provider  cloNIDine (CATAPRES) 0.1 MG tablet Take 1 tablet (0.1 mg total) by mouth 2 (two) times daily. 06/01/18 06/01/19  Rudene Re, MD  guaiFENesin-codeine 100-10 MG/5ML syrup Take 5 mLs by mouth every 4 (four) hours as needed for cough. 05/23/18   Johnn Hai, PA-C  ibuprofen (ADVIL) 600 MG tablet Take 1 tablet (600 mg total) by mouth every 6 (six) hours as needed. 08/19/19   Laban Emperor, PA-C  potassium chloride SA (KLOR-CON M20) 20 MEQ tablet Take 1 tablet (20 mEq total) by mouth 2 (two) times daily. 12/03/16   Hinda Kehr, MD    Allergies Benadryl [diphenhydramine] and Other  History reviewed. No pertinent family history.  Social History Social History   Tobacco Use  . Smoking status: Current Every Day Smoker    Types: Cigarettes  . Smokeless tobacco: Never Used  Substance Use Topics  . Alcohol use: No  . Drug use: No     Review of Systems  Constitutional: No fever/chills ENT: No upper respiratory complaints. Cardiovascular: No chest pain. Respiratory: No cough.  No SOB. Gastrointestinal: No abdominal pain.  No nausea, no vomiting.  Musculoskeletal: Positive for ankle pain. Skin: Negative for rash, abrasions, lacerations, ecchymosis. Neurological: Negative for headaches, numbness or tingling   ____________________________________________   PHYSICAL EXAM:  VITAL SIGNS: ED Triage Vitals  Enc Vitals Group     BP 08/19/19 1204 (!) 182/91     Pulse Rate 08/19/19 1204 79     Resp 08/19/19 1204 18     Temp 08/19/19 1204 98.6 F (37 C)     Temp Source 08/19/19 1204 Oral     SpO2 08/19/19 1204 95 %     Weight 08/19/19 1207 213 lb (96.6 kg)     Height 08/19/19 1207 5\' 2"  (1.575 m)     Head Circumference --      Peak Flow --      Pain Score 08/19/19 1206 9     Pain Loc --      Pain Edu? --      Excl. in [place]? --      Constitutional: Alert and oriented. Well appearing and in no acute distress. Eyes: Conjunctivae are normal. PERRL. EOMI. Head: Atraumatic. ENT:      Ears:      Nose: No congestion/rhinnorhea.      Mouth/Throat: Mucous membranes are moist.  Neck: No stridor.  Cardiovascular: Normal rate, regular rhythm.  Good peripheral circulation. Symmetric pedal pulses bilaterally.  Respiratory: Normal respiratory effort without tachypnea or retractions. Lungs CTAB. Good air  entry to the bases with no decreased or absent breath sounds. Gastrointestinal: Bowel sounds 4 quadrants. Soft and nontender to palpation. No guarding or rigidity. No palpable masses. No distention.  Musculoskeletal: Full range of motion to all extremities. No gross deformities appreciated. Mild tenderness to palpation and swelling to left lateral ankle. No calf tenderness.  No overlying erythema.  Joint is not hot. Neurologic:  Normal speech and language. No gross focal neurologic deficits are appreciated.  Skin:  Skin is warm, dry and intact. No rash noted. Psychiatric: Mood and affect are normal. Speech and behavior are normal. Patient exhibits appropriate insight and  judgement.   ____________________________________________   LABS (all labs ordered are listed, but only abnormal results are displayed)  Labs Reviewed - No data to display ____________________________________________  EKG   ____________________________________________  RADIOLOGY Lexine Baton, personally viewed and evaluated these images (plain radiographs) as part of my medical decision making, as well as reviewing the written report by the radiologist.  DG Ankle Complete Left  Result Date: 08/19/2019 CLINICAL DATA:  Ankle pain and swelling EXAM: LEFT ANKLE COMPLETE - 3+ VIEW COMPARISON:  None. FINDINGS: Generalized soft tissue swelling is noted about the ankle. No acute fracture or dislocation is noted. Tarsal degenerative changes comment tibial talar degenerative change and calcaneal spurring is seen. IMPRESSION: Generalized soft tissue swelling with degenerative change. No acute bony abnormality is noted. Electronically Signed   By: Alcide Clever M.D.   On: 08/19/2019 13:29    ____________________________________________    PROCEDURES  Procedure(s) performed:    Procedures    Medications  ketorolac (TORADOL) 30 MG/ML injection 30 mg (30 mg Intramuscular Given 08/19/19 1503)     ____________________________________________   INITIAL IMPRESSION / ASSESSMENT AND PLAN / ED COURSE  Pertinent labs & imaging results that were available during my care of the patient were reviewed by me and considered in my medical decision making (see chart for details).  Review of the [place] CSRS was performed in accordance of the [facility] prior to dispensing any controlled drugs.     Patient presented to emergency department for evaluation of left lateral ankle pain and swelling since yesterday.  Vital signs and exam are reassuring.  X-ray negative for acute bony abnormality.  IM Toradol was given for pain.  Stirrup splint was placed.  Crutches were given.  Patient will be discharged home  with prescriptions for Motrin. Patient is to follow up with primary care as directed. Patient is given ED precautions to return to the ED for any worsening or new symptoms.   Vanessa Wyatt was evaluated in Emergency Department on 08/19/2019 for the symptoms described in the history of present illness. She was evaluated in the context of the global COVID-19 pandemic, which necessitated consideration that the patient might be at risk for infection with the SARS-CoV-2 virus that causes COVID-19. Institutional protocols and algorithms that pertain to the evaluation of patients at risk for COVID-19 are in a state of rapid change based on information released by regulatory bodies including the CDC and federal and state organizations. These policies and algorithms were followed during the patient's care in the ED.  ____________________________________________  FINAL CLINICAL IMPRESSION(S) / ED DIAGNOSES  Final diagnoses:  Acute left ankle pain      NEW MEDICATIONS STARTED DURING THIS VISIT:  ED Discharge Orders         Ordered    ibuprofen (ADVIL) 600 MG tablet  Every 6 hours PRN     08/19/19 1430  This chart was dictated using voice recognition software/Dragon. Despite best efforts to proofread, errors can occur which can change the meaning. Any change was purely unintentional.    Enid Derry, PA-C 08/19/19 1615    Minna Antis, MD 08/20/19 2132

## 2019-08-19 NOTE — ED Triage Notes (Signed)
Pt here for left ankle pain.  Started yesterday after being at park with bunch little kids.  Began swelling last night. Significant pain with ambulation.  No definite injury

## 2020-04-21 ENCOUNTER — Encounter: Payer: Self-pay | Admitting: Emergency Medicine

## 2020-04-21 ENCOUNTER — Emergency Department: Payer: Self-pay

## 2020-04-21 ENCOUNTER — Other Ambulatory Visit: Payer: Self-pay

## 2020-04-21 ENCOUNTER — Emergency Department
Admission: EM | Admit: 2020-04-21 | Discharge: 2020-04-21 | Disposition: A | Discharge status: Home / Self Care | Payer: Self-pay | Attending: Emergency Medicine | Admitting: Emergency Medicine

## 2020-04-21 DIAGNOSIS — R519 Headache, unspecified: Secondary | ICD-10-CM | POA: Insufficient documentation

## 2020-04-21 DIAGNOSIS — F17210 Nicotine dependence, cigarettes, uncomplicated: Secondary | ICD-10-CM | POA: Insufficient documentation

## 2020-04-21 DIAGNOSIS — Z79899 Other long term (current) drug therapy: Secondary | ICD-10-CM | POA: Insufficient documentation

## 2020-04-21 DIAGNOSIS — I10 Essential (primary) hypertension: Secondary | ICD-10-CM | POA: Insufficient documentation

## 2020-04-21 MED ORDER — PROCHLORPERAZINE MALEATE 10 MG PO TABS: 10.0000 mg | ORAL_TABLET | Freq: Four times a day (QID) | ORAL | 0 refills | Status: DC | PRN

## 2020-04-21 MED ORDER — KETOROLAC TROMETHAMINE 30 MG/ML IJ SOLN
30.0000 mg | Freq: Once | INTRAMUSCULAR | Status: AC
  Administered 2020-04-21: 30 mg via INTRAMUSCULAR
  Filled 2020-04-21: qty 1

## 2020-04-21 NOTE — ED Notes (Signed)
Patient verbalizes understanding of discharge instructions. Opportunity for questioning and answers were provided. Armband removed by staff, pt discharged from ED. Ambulated out to lobby  

## 2020-04-21 NOTE — ED Provider Notes (Signed)
[facility] Emergency Department Provider Note   ____________________________________________   Event Date/Time   First MD Initiated Contact with Patient 04/21/20 1313     (approximate)  I have reviewed the triage vital signs and the nursing notes.   HISTORY  Chief Complaint Headache    HPI Vanessa Wyatt is a 55 y.o. female with past medical history of hypertension who presents to the ED complaining of headache.  Patient ports she has been dealing with intermittent headache for the past couple of days which became more severe shortly after she arrived at work around 8:00 this morning.  She describes it as a throbbing that primarily affects the left side of her head.  Pain has not been exacerbated or alleviated by anything in particular.  She denies any vision changes, speech changes, numbness, or weakness.  She states she has been compliant with her hypertensive medications, typically takes it once daily at night but has not missed any doses recently.  She denies any fevers, cough, chest pain, or shortness of breath.        Past Medical History:  Diagnosis Date  . Hypertension     There are no problems to display for this patient.   History reviewed. No pertinent surgical history.  Prior to Admission medications   Medication Sig Start Date End Date Taking? Authorizing Provider  prochlorperazine (COMPAZINE) 10 MG tablet Take 1 tablet (10 mg total) by mouth every 6 (six) hours as needed for nausea or vomiting. 04/21/20  Yes Chesley Noon, MD  cloNIDine (CATAPRES) 0.1 MG tablet Take 1 tablet (0.1 mg total) by mouth 2 (two) times daily. 06/01/18 06/01/19  Nita Sickle, MD  guaiFENesin-codeine 100-10 MG/5ML syrup Take 5 mLs by mouth every 4 (four) hours as needed for cough. 05/23/18   Tommi Rumps, PA-C  ibuprofen (ADVIL) 600 MG tablet Take 1 tablet (600 mg total) by mouth every 6 (six) hours as needed. 08/19/19   Enid Derry, PA-C  potassium  chloride SA (KLOR-CON M20) 20 MEQ tablet Take 1 tablet (20 mEq total) by mouth 2 (two) times daily. 12/03/16   Loleta Rose, MD    Allergies Benadryl [diphenhydramine] and Other  No family history on file.  Social History Social History   Tobacco Use  . Smoking status: Current Every Day Smoker    Types: Cigarettes  . Smokeless tobacco: Never Used  Substance Use Topics  . Alcohol use: No  . Drug use: No    Review of Systems  Constitutional: No fever/chills Eyes: No visual changes. ENT: No sore throat. Cardiovascular: Denies chest pain. Respiratory: Denies shortness of breath. Gastrointestinal: No abdominal pain.  No nausea, no vomiting.  No diarrhea.  No constipation. Genitourinary: Negative for dysuria. Musculoskeletal: Negative for back pain. Skin: Negative for rash. Neurological: Positive for headaches, negative for focal weakness or numbness.  ____________________________________________   PHYSICAL EXAM:  VITAL SIGNS: ED Triage Vitals  Enc Vitals Group     BP 04/21/20 1058 (!) 174/99     Pulse Rate 04/21/20 1058 72     Resp 04/21/20 1058 18     Temp 04/21/20 1058 (!) 97.5 F (36.4 C)     Temp Source 04/21/20 1058 Oral     SpO2 04/21/20 1058 100 %     Weight 04/21/20 1059 235 lb (106.6 kg)     Height 04/21/20 1059 5\' 2"  (1.575 m)     Head Circumference --      Peak Flow --  Pain Score 04/21/20 1059 9     Pain Loc --      Pain Edu? --      Excl. in GC? --     Constitutional: Alert and oriented. Eyes: Conjunctivae are normal.  Pupils equal round and reactive to light bilaterally.  Extraocular movements intact. Head: Atraumatic. Nose: No congestion/rhinnorhea. Mouth/Throat: Mucous membranes are moist. Neck: Normal ROM, no meningismus. Cardiovascular: Normal rate, regular rhythm. Grossly normal heart sounds. Respiratory: Normal respiratory effort.  No retractions. Lungs CTAB. Gastrointestinal: Soft and nontender. No distention. Genitourinary:  deferred Musculoskeletal: No lower extremity tenderness nor edema. Neurologic:  Normal speech and language. No gross focal neurologic deficits are appreciated. Skin:  Skin is warm, dry and intact. No rash noted. Psychiatric: Mood and affect are normal. Speech and behavior are normal.  ____________________________________________   LABS (all labs ordered are listed, but only abnormal results are displayed)  Labs Reviewed - No data to display   PROCEDURES  Procedure(s) performed (including Critical Care):  Procedures   ____________________________________________   INITIAL IMPRESSION / ASSESSMENT AND PLAN / ED COURSE       55 year old female with past medical history of hypertension who presents to the ED with intermittent headache for the past couple of days, which had resolved but came back with severe symptoms around 8-8 30 this morning.  We will check CT head to ensure no SAH but patient has no focal neurologic deficits.  No symptoms to suggest meningitis.  We will treat with IM Toradol and reassess.  CT head reviewed by me with no obvious hemorrhage, negative for acute process per radiology.  Patient reports headache is improved following IM Toradol and she is appropriate for discharge home.  She was counseled to follow-up with her PCP and otherwise return to the ED for new worsening symptoms.  Patient agrees with plan.      ____________________________________________   FINAL CLINICAL IMPRESSION(S) / ED DIAGNOSES  Final diagnoses:  Acute nonintractable headache, unspecified headache type     ED Discharge Orders         Ordered    prochlorperazine (COMPAZINE) 10 MG tablet  Every 6 hours PRN        04/21/20 1405           Note:  This document was prepared using Dragon voice recognition software and may include unintentional dictation errors.   Chesley Noon, MD 04/21/20 [phone]

## 2020-04-21 NOTE — ED Triage Notes (Addendum)
Patient to ER for c/o headache since Saturday night. States headache went away, but came back this am. Reports headache is "all over", sometimes hurts in left ear and at sinus area on face.  Patient has h/o HTN, hypertensive in triage (174/99). Takes HTN medication once daily at night.

## 2020-07-22 ENCOUNTER — Other Ambulatory Visit: Payer: Self-pay | Admitting: Pediatrics

## 2020-07-22 DIAGNOSIS — M25572 Pain in left ankle and joints of left foot: Secondary | ICD-10-CM

## 2021-04-09 ENCOUNTER — Other Ambulatory Visit: Payer: Self-pay

## 2021-04-09 ENCOUNTER — Emergency Department
Admission: EM | Admit: 2021-04-09 | Discharge: 2021-04-09 | Disposition: A | Discharge status: Home / Self Care | Payer: Self-pay | Attending: Emergency Medicine | Admitting: Emergency Medicine

## 2021-04-09 ENCOUNTER — Encounter: Payer: Self-pay | Admitting: Emergency Medicine

## 2021-04-09 DIAGNOSIS — H6501 Acute serous otitis media, right ear: Secondary | ICD-10-CM | POA: Insufficient documentation

## 2021-04-09 DIAGNOSIS — Z20822 Contact with and (suspected) exposure to covid-19: Secondary | ICD-10-CM | POA: Insufficient documentation

## 2021-04-09 DIAGNOSIS — H6500 Acute serous otitis media, unspecified ear: Secondary | ICD-10-CM

## 2021-04-09 LAB — RESP PANEL BY RT-PCR (FLU A&B, COVID) ARPGX2
Influenza A by PCR: NEGATIVE
Influenza B by PCR: NEGATIVE
SARS Coronavirus 2 by RT PCR: NEGATIVE

## 2021-04-09 MED ORDER — AMOXICILLIN 875 MG PO TABS: 875.0000 mg | ORAL_TABLET | Freq: Two times a day (BID) | ORAL | 0 refills | Status: DC

## 2021-04-09 MED ORDER — PREDNISONE 10 MG PO TABS: 30.0000 mg | ORAL_TABLET | Freq: Every day | ORAL | 0 refills | Status: DC

## 2021-04-09 NOTE — ED Triage Notes (Signed)
Pt comes into the ED via POV c/o sore throat and otalgia of the right ear.  Pt states this has been going on for a week but last night it got worse.  Pt in NAd.

## 2021-04-09 NOTE — ED Notes (Addendum)
D/C and new RX discussed with pt, pt verbalized understanding. Reasons to return to ED dicussed with pt. NAD noted. Pt ambulatory on D/C.   BP high on D/C, pt states she has not taken morning BP meds, pt educated to take when home, pt verbalized understanding.

## 2021-04-09 NOTE — ED Provider Notes (Signed)
[facility] Provider Note    Event Date/Time   First MD Initiated Contact with Patient 04/09/21 0920     (approximate)   History   Otalgia and Sore Throat   HPI  Vanessa Wyatt is a 56 y.o. female with history of hypertension presents with right ear pain, right-sided throat pain, and cough.  Patient states she has been sick for about a week but got worse last night.  No fever or chills.  No vomiting or diarrhea.  Patient is vaccinated for COVID      Physical Exam   Triage Vital Signs: ED Triage Vitals  Enc Vitals Group     BP 04/09/21 0855 (!) 181/99     Pulse Rate 04/09/21 0855 84     Resp 04/09/21 0855 20     Temp 04/09/21 0855 98.2 F (36.8 C)     Temp Source 04/09/21 0855 Oral     SpO2 04/09/21 0855 100 %     Weight 04/09/21 0852 235 lb 0.2 oz (106.6 kg)     Height 04/09/21 0852 5\' 2"  (1.575 m)     Head Circumference --      Peak Flow --      Pain Score 04/09/21 0852 9     Pain Loc --      Pain Edu? --      Excl. in [place]? --     Most recent vital signs: Vitals:   04/09/21 0855  BP: (!) 181/99  Pulse: 84  Resp: 20  Temp: 98.2 F (36.8 C)  SpO2: 100%     General: Awake, no distress.   CV:  Good peripheral perfusion.  Regular rate and rhythm, BP is elevated Resp:  Normal effort.  Lungs CTA Abd:  No distention.   Other:  Right TM is dull with lots of fluid, left TM is red, throat is mildly red and swollen, cough is noted   ED Results / Procedures / Treatments   Labs (all labs ordered are listed, but only abnormal results are displayed) Labs Reviewed  RESP PANEL BY RT-PCR (FLU A&B, COVID) [id number]     EKG     RADIOLOGY     PROCEDURES:  Critical Care performed: no   Procedures   MEDICATIONS ORDERED IN ED: Medications - No data to display   IMPRESSION / MDM / [place] / ED COURSE  I reviewed the triage vital signs and the nursing notes.                              Differential diagnosis  includes, but is not limited to, otitis media, acute pharyngitis, COVID, influenza  Patient appears to be very stable.  Her TM is dull and red so we will start her on medication for otitis media.  However I do have concerns of COVID due to the cough along with sore throat.  Respiratory panel was obtained.  Patient will be able to see her results on [facility] MyChart or I will call her later today.  She is in agreement with our treatment plan.  She was started on amoxicillin and prednisone 30 mg daily for 3 days to open the eustachian tube.  She was discharged stable condition.  She was also given a work note         FINAL CLINICAL IMPRESSION(S) / ED DIAGNOSES   Final diagnoses:  Acute serous otitis media, recurrence not specified,  unspecified laterality  Suspected COVID-19 virus infection     Rx / DC Orders   ED Discharge Orders          Ordered    amoxicillin (AMOXIL) 875 MG tablet  2 times daily,   Status:  Discontinued        04/09/21 0925    predniSONE (DELTASONE) 10 MG tablet  Daily with breakfast        04/09/21 0925    amoxicillin (AMOXIL) 875 MG tablet  2 times daily        04/09/21 [id number]             Note:  This document was prepared using Dragon voice recognition software and may include unintentional dictation errors.    Versie Starks, PA-C 04/09/21 0930    Vladimir Crofts, MD 04/09/21 [phone]

## 2021-04-09 NOTE — Discharge Instructions (Signed)
Follow up with your regular doctor if not better in 3 days Return if worsening I will call you about your covid swab or you can see the results on [facility] MYchart Take the medication as prescribed

## 2021-07-21 DIAGNOSIS — M2392 Unspecified internal derangement of left knee: Secondary | ICD-10-CM | POA: Diagnosis not present

## 2021-09-10 DIAGNOSIS — I10 Essential (primary) hypertension: Secondary | ICD-10-CM | POA: Diagnosis not present

## 2021-09-10 DIAGNOSIS — Z1211 Encounter for screening for malignant neoplasm of colon: Secondary | ICD-10-CM | POA: Diagnosis not present

## 2021-09-10 DIAGNOSIS — E119 Type 2 diabetes mellitus without complications: Secondary | ICD-10-CM | POA: Diagnosis not present

## 2022-01-13 ENCOUNTER — Emergency Department
Admission: EM | Admit: 2022-01-13 | Discharge: 2022-01-13 | Disposition: A | Discharge status: Home / Self Care | Payer: Self-pay | Attending: Emergency Medicine | Admitting: Emergency Medicine

## 2022-01-13 ENCOUNTER — Other Ambulatory Visit: Payer: Self-pay

## 2022-01-13 DIAGNOSIS — M25511 Pain in right shoulder: Secondary | ICD-10-CM | POA: Insufficient documentation

## 2022-01-13 DIAGNOSIS — R030 Elevated blood-pressure reading, without diagnosis of hypertension: Secondary | ICD-10-CM | POA: Insufficient documentation

## 2022-01-13 DIAGNOSIS — J320 Chronic maxillary sinusitis: Secondary | ICD-10-CM

## 2022-01-13 DIAGNOSIS — J0100 Acute maxillary sinusitis, unspecified: Secondary | ICD-10-CM | POA: Insufficient documentation

## 2022-01-13 MED ORDER — AMOXICILLIN-POT CLAVULANATE 875-125 MG PO TABS: 1.0000 | ORAL_TABLET | Freq: Two times a day (BID) | ORAL | 0 refills | Status: DC

## 2022-01-13 MED ORDER — PREDNISONE 20 MG PO TABS: ORAL_TABLET | ORAL | 0 refills | Status: DC

## 2022-01-13 MED ORDER — FLUTICASONE PROPIONATE 50 MCG/ACT NA SUSP: 2.0000 | Freq: Every day | NASAL | 0 refills | Status: DC

## 2022-01-13 NOTE — Discharge Instructions (Addendum)
Follow-up with your primary care provider if any continued problems or concerns.  Also have them recheck your blood pressure as it was elevated in the emergency department and remember to take your blood pressure medication daily.  Prescriptions were sent to your pharmacy to begin taking today.  Also the Flonase nasal spray is 2 sprays each side of your nose once a day, prednisone 2 tablets once a day and the antibiotic Augmentin is 1 tablet twice a day.  You may also take Tylenol with this if pain medication is needed.

## 2022-01-13 NOTE — ED Provider Notes (Addendum)
[facility] Provider Note    Event Date/Time   First MD Initiated Contact with Patient 01/13/22 1013     (approximate)   History   Shoulder Pain and Facial Pain (Pt. To ED via POV for right facial pain, eye redness and watering.)   HPI  Vanessa Wyatt is a 56 y.o. female presents to the ED with complaint of right-sided facial pain that has been going on for several days.  Her doctor has prescribed allergy medicine (Zyrtec) for her which she has been taking daily for the last 2 months.  She denies any visual changes, dizziness, headache, shortness of breath, visual changes or drainage from her eye.     Physical Exam   Triage Vital Signs: ED Triage Vitals  Enc Vitals Group     BP 01/13/22 0958 (!) 188/91     Pulse Rate 01/13/22 0958 65     Resp 01/13/22 0958 18     Temp 01/13/22 0958 98.4 F (36.9 C)     Temp Source 01/13/22 0958 Oral     SpO2 01/13/22 0958 94 %     Weight 01/13/22 0959 235 lb (106.6 kg)     Height 01/13/22 0959 5\' 2"  (1.575 m)     Head Circumference --      Peak Flow --      Pain Score 01/13/22 0959 8     Pain Loc --      Pain Edu? --      Excl. in [place]? --     Most recent vital signs: Vitals:   01/13/22 0958  BP: (!) 188/91  Pulse: 65  Resp: 18  Temp: 98.4 F (36.9 C)  SpO2: 94%     General: Awake, no distress.  CV:  Good peripheral perfusion.  Heart regular rate and rhythm. Resp:  Normal effort.  Lungs are clear bilaterally. Abd:  No distention.  Other:  Conjunctive clear bilaterally.  Tenderness noted to percussion of the right frontal and especially maxillary sinuses.  Right EAC and TM are clear.  Posterior pharynx without erythema, exudate or edema.  Uvula is midline.  No cervical lymphadenopathy appreciated.   ED Results / Procedures / Treatments   Labs (all labs ordered are listed, but only abnormal results are displayed) Labs Reviewed - No data to display     PROCEDURES:  Critical Care performed:    Procedures   MEDICATIONS ORDERED IN ED: Medications - No data to display   IMPRESSION / MDM / [place] / ED COURSE  I reviewed the triage vital signs and the nursing notes.   Differential diagnosis includes, but is not limited to, sinusitis, conjunctivitis, otitis media, otitis externa, upper respiratory infection.  56 year old female presents to the ED with complaint of right-sided facial pain for the last 3 days.  She continues to take her Zyrtec that was prescribed for her by her doctor without any relief and has continued to take it daily for the last 2 months.  She states the last several days has been different.  She does have tenderness on percussion of her right maxillary and frontal sinuses.  Patient was made aware that 3 prescriptions were sent to the pharmacy but to continue taking her Zyrtec.  Augmentin, Flonase and prednisone was sent to the pharmacy.  She is to follow-up with her PCP if any continued problems or not improving.  She also admits that she did not take her blood pressure medication today and was reminded  that she needs to take it daily.  Patient has the blood pressure medication in her pocket book.      Patient's presentation is most consistent with acute, uncomplicated illness.  FINAL CLINICAL IMPRESSION(S) / ED DIAGNOSES   Final diagnoses:  Right maxillary sinusitis  Elevated blood pressure reading     Rx / DC Orders   ED Discharge Orders          Ordered    amoxicillin-clavulanate (AUGMENTIN) 875-125 MG tablet  2 times daily        01/13/22 1105    predniSONE (DELTASONE) 20 MG tablet        01/13/22 1105    fluticasone (FLONASE) 50 MCG/ACT nasal spray  Daily        01/13/22 1105             Note:  This document was prepared using Dragon voice recognition software and may include unintentional dictation errors.   Tommi Rumps, PA-C 01/13/22 1115    Tommi Rumps, PA-C 01/13/22 1116    Jene Every,  MD 01/13/22 1230

## 2022-01-13 NOTE — ED Triage Notes (Addendum)
Pt. To ED via POV for right facial pain, eye redness and watering. Pt. Denies visual disturbances, CP, SOB, dizziness, or headache.

## 2022-01-27 IMAGING — CR DG ANKLE COMPLETE 3+V*L*
1 series · 3 of 3 positions shown · non-contrast
Comparison: None.

CLINICAL DATA: Ankle pain and swelling

EXAM:
LEFT ANKLE COMPLETE - 3+ VIEW

[Series 1: dg ankle complete left · 0.14mm/px · 3 of 3 slices shown]
[im 1/3]
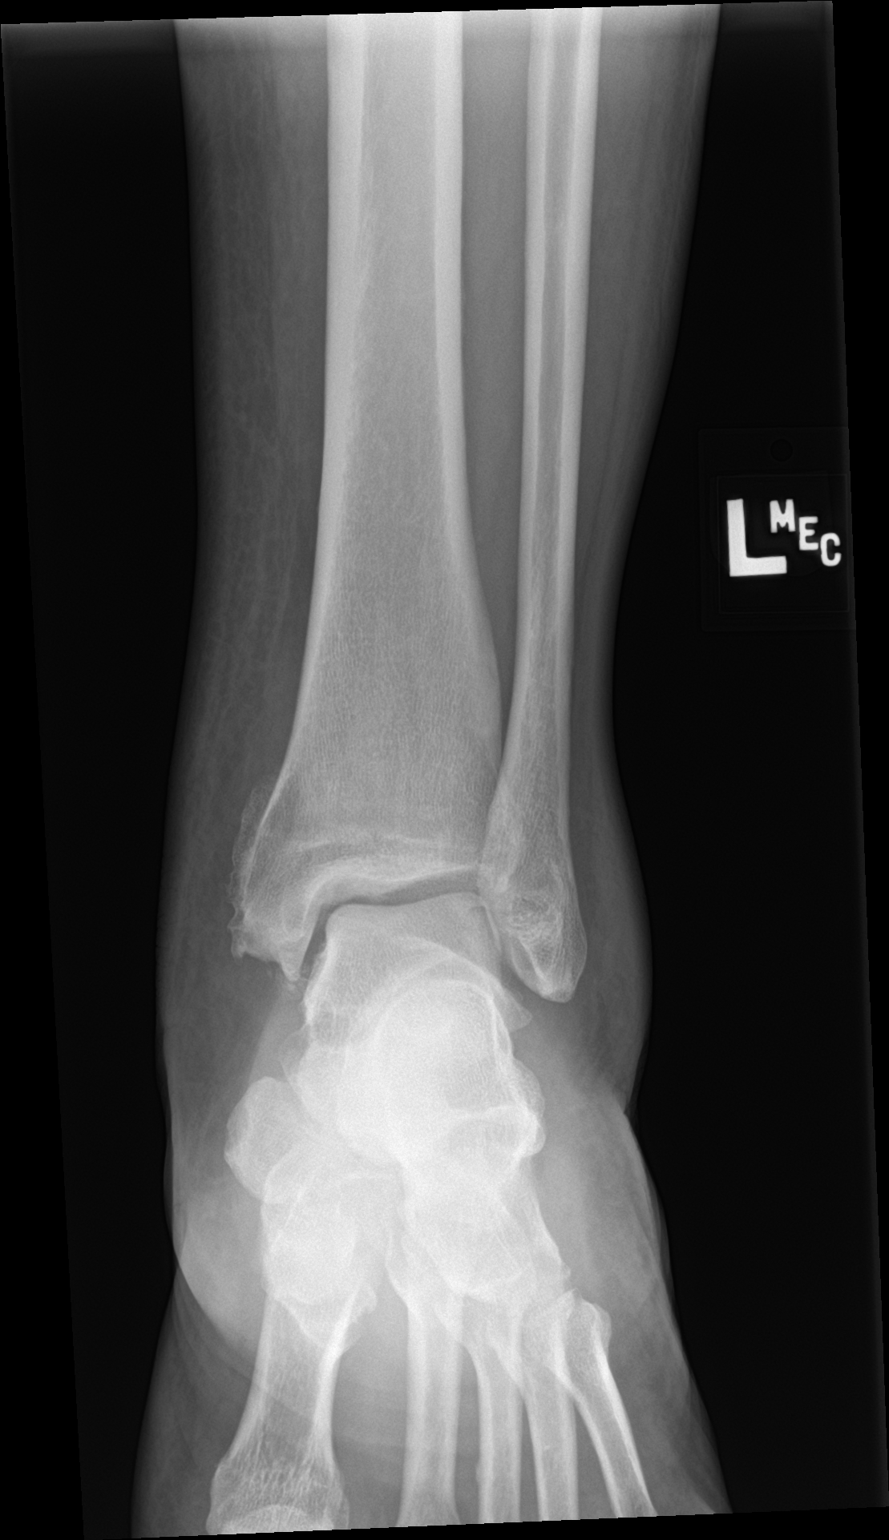
[im 2/3]
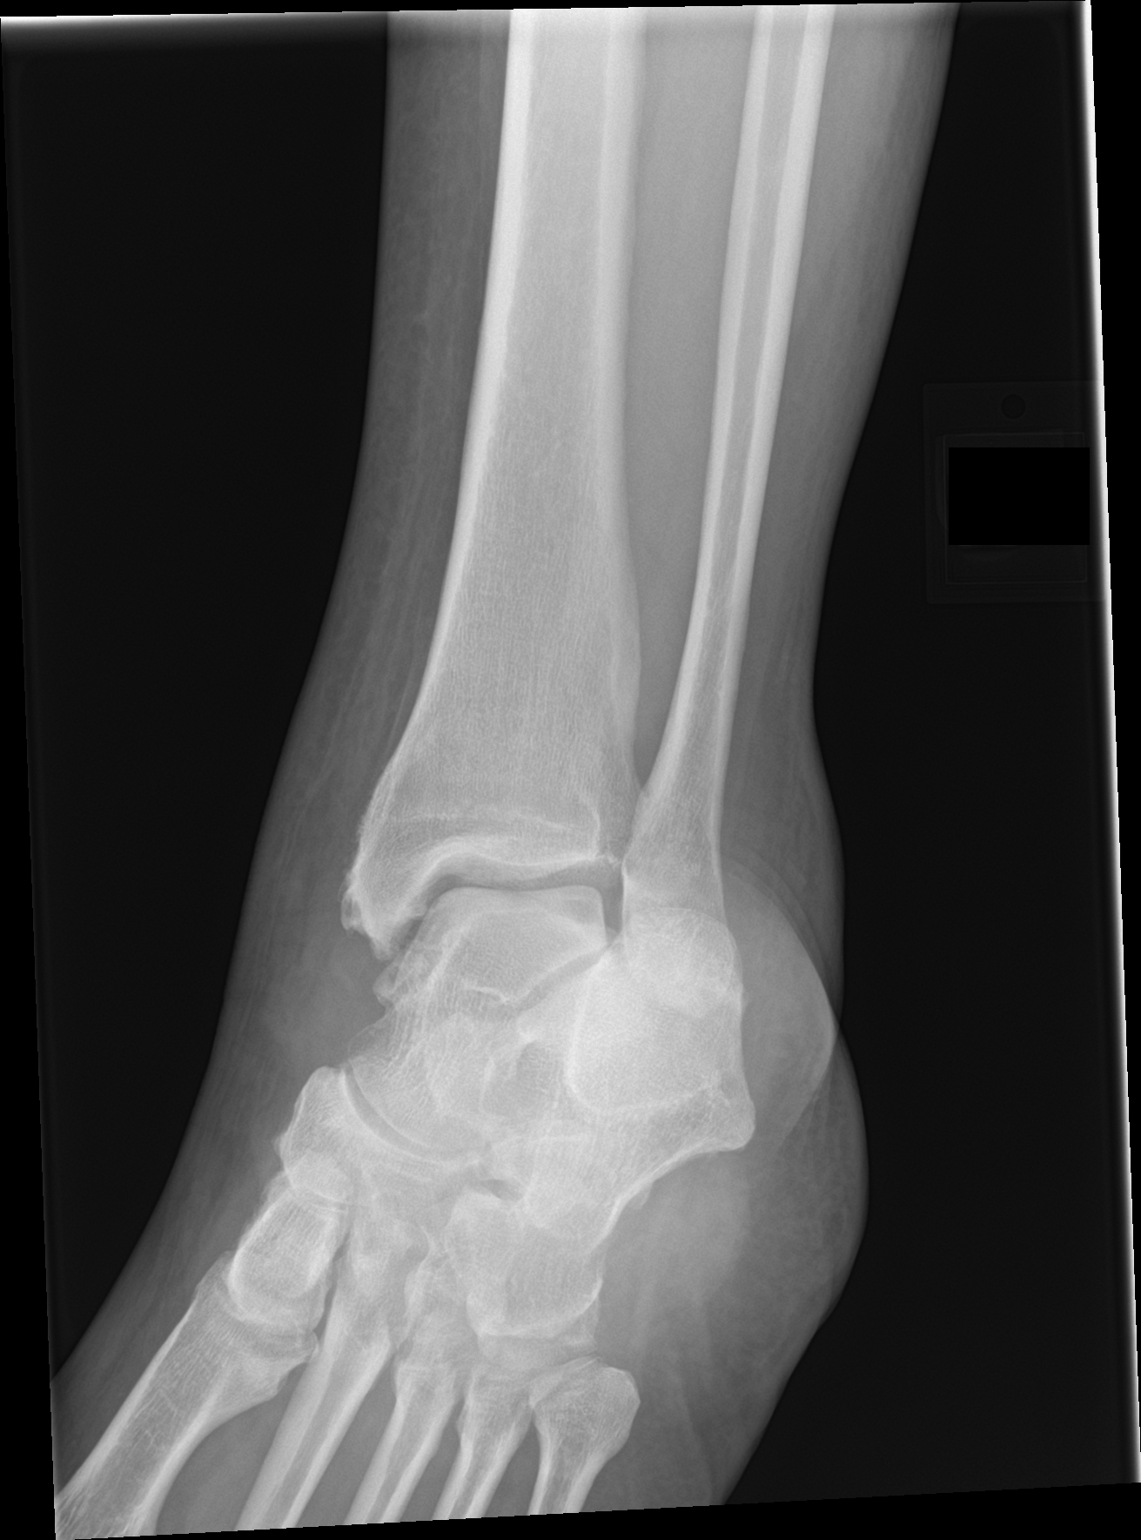
[im 3/3]
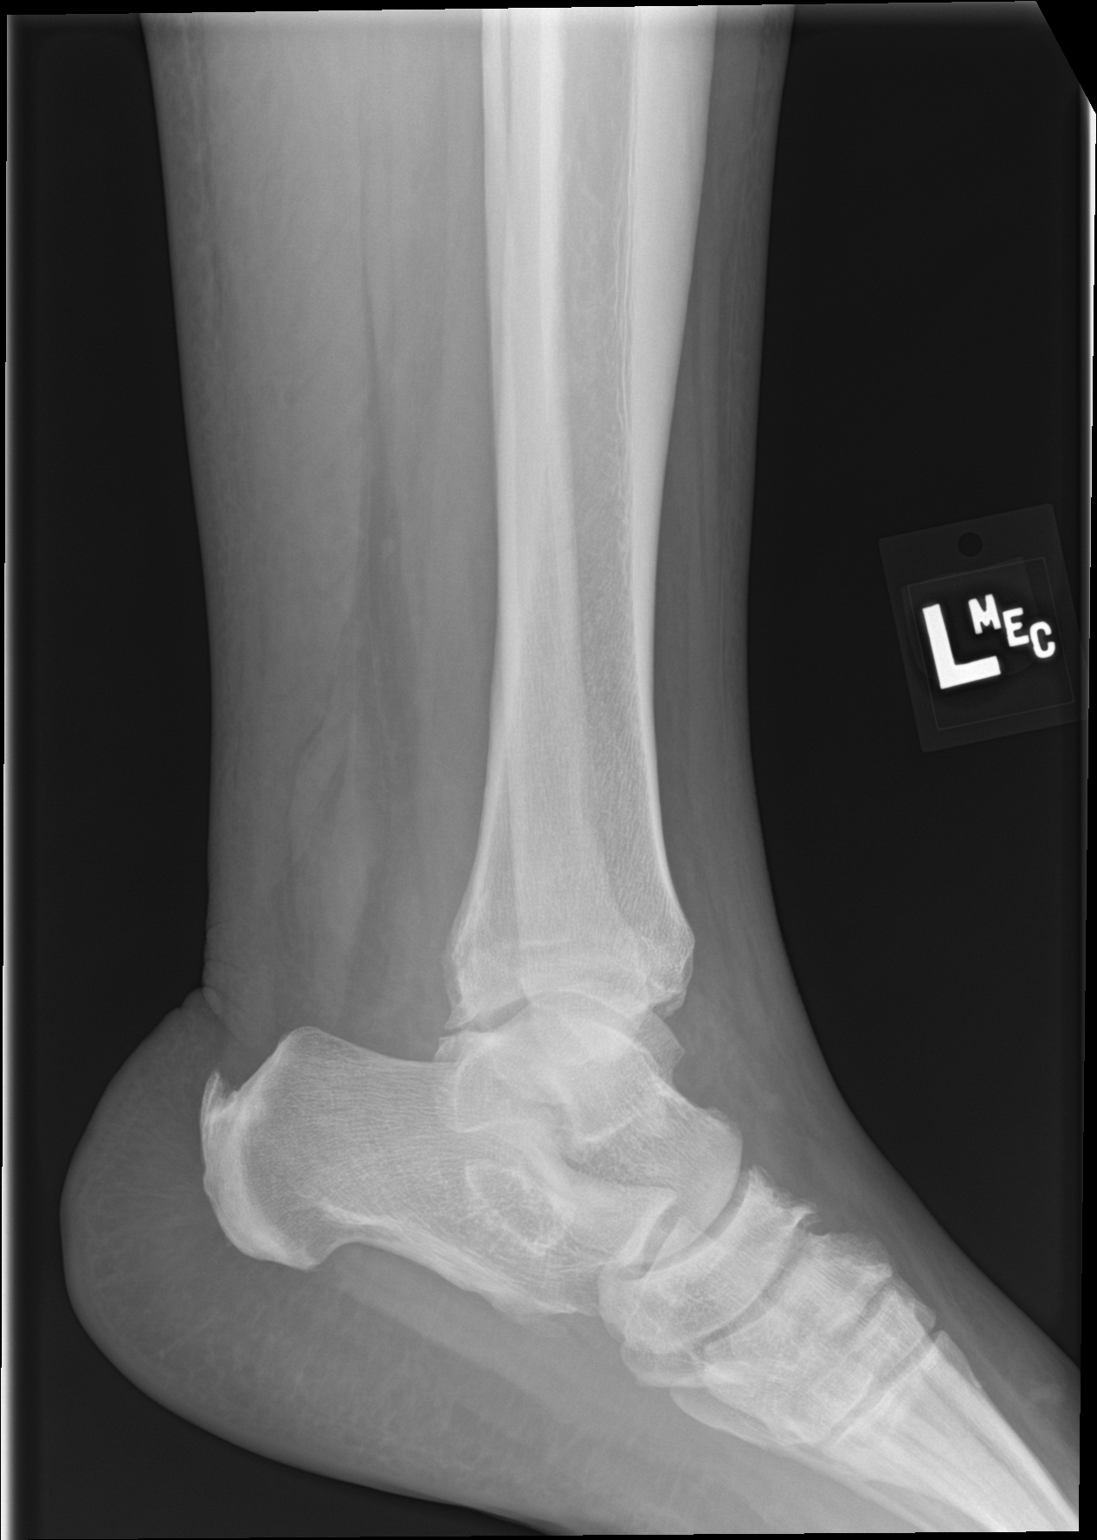

[3 of 3 positions shown; findings below may reference images not displayed]

FINDINGS: Generalized soft tissue swelling is noted about the ankle. No acute
fracture or dislocation is noted. Tarsal degenerative changes
comment tibial talar degenerative change and calcaneal spurring is
seen.
IMPRESSION: Generalized soft tissue swelling with degenerative change. No acute
bony abnormality is noted.

## 2022-04-27 DIAGNOSIS — Z0131 Encounter for examination of blood pressure with abnormal findings: Secondary | ICD-10-CM | POA: Diagnosis not present

## 2022-04-27 DIAGNOSIS — Z0130 Encounter for examination of blood pressure without abnormal findings: Secondary | ICD-10-CM | POA: Diagnosis not present

## 2022-04-27 DIAGNOSIS — I10 Essential (primary) hypertension: Secondary | ICD-10-CM | POA: Diagnosis not present

## 2022-04-27 DIAGNOSIS — Z1389 Encounter for screening for other disorder: Secondary | ICD-10-CM | POA: Diagnosis not present

## 2022-04-27 DIAGNOSIS — Z91141 Patient's other noncompliance with medication regimen due to financial hardship: Secondary | ICD-10-CM | POA: Diagnosis not present

## 2022-04-27 DIAGNOSIS — Z0000 Encounter for general adult medical examination without abnormal findings: Secondary | ICD-10-CM | POA: Diagnosis not present

## 2022-04-27 DIAGNOSIS — Z712 Person consulting for explanation of examination or test findings: Secondary | ICD-10-CM | POA: Diagnosis not present

## 2022-07-02 ENCOUNTER — Emergency Department
Admission: EM | Admit: 2022-07-02 | Discharge: 2022-07-02 | Disposition: A | Discharge status: Home / Self Care | Payer: 59 | Attending: Emergency Medicine | Admitting: Emergency Medicine

## 2022-07-02 ENCOUNTER — Other Ambulatory Visit: Payer: Self-pay

## 2022-07-02 DIAGNOSIS — R58 Hemorrhage, not elsewhere classified: Secondary | ICD-10-CM | POA: Diagnosis not present

## 2022-07-02 DIAGNOSIS — I10 Essential (primary) hypertension: Secondary | ICD-10-CM | POA: Diagnosis not present

## 2022-07-02 DIAGNOSIS — W44F4XA Insect entering into or through a natural orifice, initial encounter: Secondary | ICD-10-CM | POA: Diagnosis not present

## 2022-07-02 DIAGNOSIS — T162XXA Foreign body in left ear, initial encounter: Secondary | ICD-10-CM | POA: Insufficient documentation

## 2022-07-02 MED ORDER — LIDOCAINE VISCOUS HCL 2 % MT SOLN
5.0000 mL | Freq: Once | OROMUCOSAL | Status: AC
  Administered 2022-07-02: 5 mL via ORAL
  Filled 2022-07-02: qty 15

## 2022-07-02 MED ORDER — CIPRO HC 0.2-1 % OT SUSP: 3.0000 [drp] | Freq: Two times a day (BID) | OTIC | 0 refills | Status: AC

## 2022-07-02 NOTE — Discharge Instructions (Addendum)
You may alternate Tylenol 1000 mg every 6 hours as needed for pain, fever and Ibuprofen 800 mg every 6-8 hours as needed for pain, fever.  Please take Ibuprofen with food.  Do not take more than 4000 mg of Tylenol (acetaminophen) in a 24 hour period. ° °

## 2022-07-02 NOTE — ED Notes (Signed)
[facility] called due to cipro/hydrocortisone ear  drops are not covered and request to switch to ciprodex ear drops.  Okay per Dr. Marisa Severin.  They will change it.

## 2022-07-02 NOTE — ED Triage Notes (Signed)
Pt to ED via [facility] for left ear pain. Pain woke her up from sleep and it felt like something was crawling in ear. Pt used ear drops and then noticed blood out of ear.

## 2022-07-02 NOTE — ED Provider Notes (Signed)
[facility] Provider Note    Event Date/Time   First MD Initiated Contact with Patient 07/02/22 [phone]     (approximate)   History   Otalgia   HPI  Vanessa Wyatt is a 57 y.o. female history of hypertension who presents to the emergency department with concerns that there is an insect in her left ear canal.  States it woke her from sleep state.  States she put eardrops in her ear and then noticed some blood.  No other foreign body in the ear.  No fever.   History provided by patient.    Past Medical History:  Diagnosis Date   Hypertension     History reviewed. No pertinent surgical history.  MEDICATIONS:  Prior to Admission medications   Medication Sig Start Date End Date Taking? Authorizing Provider  amoxicillin-clavulanate (AUGMENTIN) 875-125 MG tablet Take 1 tablet by mouth 2 (two) times daily. 01/13/22   Tommi RumpsSummers, Rhonda L, PA-C  cloNIDine (CATAPRES) 0.1 MG tablet Take 1 tablet (0.1 mg total) by mouth 2 (two) times daily. 06/01/18 06/01/19  Nita SickleVeronese, Ada, MD  fluticasone [facility](FLONASE) 50 MCG/ACT nasal spray Place 2 sprays into both nostrils daily. 01/13/22 01/13/23  Tommi RumpsSummers, Rhonda L, PA-C  potassium chloride SA (KLOR-CON M20) 20 MEQ tablet Take 1 tablet (20 mEq total) by mouth 2 (two) times daily. 12/03/16   Loleta RoseForbach, Cory, MD  predniSONE (DELTASONE) 20 MG tablet Take 2 tablets once a day for 7 days 01/13/22   Tommi RumpsSummers, Rhonda L, PA-C    Physical Exam   Triage Vital Signs: ED Triage Vitals  Enc Vitals Group     BP 07/02/22 0522 (!) 160/87     Pulse Rate 07/02/22 0522 81     Resp 07/02/22 0522 16     Temp 07/02/22 0522 98.1 F (36.7 C)     Temp Source 07/02/22 0521 Oral     SpO2 07/02/22 0522 96 %     Weight 07/02/22 0520 232 lb (105.2 kg)     Height 07/02/22 0520 5\' 2"  (1.575 m)     Head Circumference --      Peak Flow --      Pain Score 07/02/22 0519 9     Pain Loc --      Pain Edu? --      Excl. in [place]? --     Most recent vital  signs: Vitals:   07/02/22 0522  BP: (!) 160/87  Pulse: 81  Resp: 16  Temp: 98.1 F (36.7 C)  SpO2: 96%     CONSTITUTIONAL: Alert and responds appropriately to questions. Well-appearing; well-nourished HEAD: Normocephalic, atraumatic EYES: Conjunctivae clear, pupils appear equal ENT: normal nose; moist mucous membranes; TMs are clear bilaterally without erythema, purulence, bulging, perforation, effusion.  No cerumen impaction or sign of foreign body in the external auditory canal on the right. No inflammation, erythema or drainage from the external auditory canal on the right.  There is an insect that is moving around within the left ear canal up against the left tympanic membrane.  There is a small abrasion with a small amount of dried blood to the external auditory canal on the left.  No signs of mastoiditis. No pain with manipulation of the pinna bilaterally. NECK: Normal range of motion CARD: Regular rate and rhythm RESP: Normal chest excursion without splinting or tachypnea; no hypoxia or respiratory distress, speaking full sentences ABD/GI: non-distended EXT: Normal ROM in all joints, no major deformities noted SKIN: Normal color for  age and race, no rashes on exposed skin NEURO: Moves all extremities equally, normal speech, no facial asymmetry noted PSYCH: The patient's mood and manner are appropriate. Grooming and personal hygiene are appropriate.  ED Results / Procedures / Treatments   LABS: (all labs ordered are listed, but only abnormal results are displayed) Labs Reviewed - No data to display   EKG:  EKG Interpretation  Date/Time:    Ventricular Rate:    PR Interval:    QRS Duration:   QT Interval:    QTC Calculation:   R Axis:     Text Interpretation:            RADIOLOGY: My personal review and interpretation of imaging:    I have personally reviewed all radiology reports. No results found.   PROCEDURES:  Critical Care performed:  No     .Ear Cerumen Removal  Date/Time: 07/02/2022 5:33 AM  Performed by: Katasha Riga, Layla Maw, DO Authorized by: Jameca Chumley, Layla Maw, DO   Consent:    Consent obtained:  Verbal   Consent given by:  Patient   Risks discussed:  Bleeding, dizziness, infection, pain, TM perforation and incomplete removal   Alternatives discussed:  Alternative treatment Universal protocol:    Procedure explained and questions answered to patient or proxy's satisfaction: yes     Relevant documents present and verified: yes     Test results available: yes     Imaging studies available: yes     Required blood products, implants, devices, and special equipment available: yes     Site/side marked: yes     Immediately prior to procedure, a time out was called: yes     Patient identity confirmed:  Verbally with patient Procedure details:    Location:  L ear   Procedure type comment:  Rremoved foreign body with alligator forceps Post-procedure details:    Inspection:  TM intact   Hearing quality:  Normal   Procedure completion:  Tolerated well, no immediate complications Comments:     Insect completely removed from the left external auditory canal.     IMPRESSION / MDM / ASSESSMENT AND PLAN / ED COURSE  I reviewed the triage vital signs and the nursing notes.   Patient here with an insect in the left external auditory canal.     DIFFERENTIAL DIAGNOSIS (includes but not limited to):   Foreign body, no sign of otitis media, otitis externa, mastoiditis  Patient's presentation is most consistent with acute complicated illness / injury requiring diagnostic workup.  PLAN: Will place lidocaine in the ear for discomfort and to kill the insect and then irrigate versus retrieve using forceps to remove the insect.   MEDICATIONS GIVEN IN ED: Medications  lidocaine (XYLOCAINE) 2 % viscous mouth solution 5 mL (5 mLs Oral Given 07/02/22 0536)     ED COURSE: I was able to retrieve the insect intact using alligator  forceps.  On visual inspection she has some irritation to the tympanic membrane but no perforation.  She has a large abrasion that is now slightly bleeding to the external auditory canal.  She denies that she put anything into her ear and I am guessing this was from the insect itself.  Will put her on Cipro otic drops twice daily for the next 5 days prophylactically and give ENT follow-up as needed.  Recommended Tylenol, Motrin as needed for pain.   At this time, I do not feel there is any life-threatening condition present. I reviewed all nursing notes, vitals,  pertinent previous records.  All lab and urine results, EKGs, imaging ordered have been independently reviewed and interpreted by myself.  I reviewed all available radiology reports from any imaging ordered this visit.  Based on my assessment, I feel the patient is safe to be discharged home without further emergent workup and can continue workup as an outpatient as needed. Discussed all findings, treatment plan as well as usual and customary return precautions.  They verbalize understanding and are comfortable with this plan.  Outpatient follow-up has been provided as needed.  All questions have been answered.    CONSULTS:  none   OUTSIDE RECORDS REVIEWED: Reviewed last PCP note in February 2022.     FINAL CLINICAL IMPRESSION(S) / ED DIAGNOSES   Final diagnoses:  Foreign body of left ear, initial encounter     Rx / DC Orders   ED Discharge Orders          Ordered    ciprofloxacin-hydrocortisone (CIPRO HC) OTIC suspension  2 times daily        07/02/22 0630             Note:  This document was prepared using Dragon voice recognition software and may include unintentional dictation errors.   Lavida Patch, Layla MawKristen N, [place]DO 07/02/22 [phone]

## 2022-07-02 NOTE — ED Triage Notes (Signed)
EMS brings pt in from home for c/o left ear pain

## 2022-08-18 ENCOUNTER — Other Ambulatory Visit: Payer: Self-pay

## 2022-08-18 ENCOUNTER — Emergency Department: Payer: BLUE CROSS/BLUE SHIELD

## 2022-08-18 ENCOUNTER — Emergency Department
Admission: EM | Admit: 2022-08-18 | Discharge: 2022-08-18 | Disposition: A | Discharge status: Home / Self Care | Payer: BLUE CROSS/BLUE SHIELD | Attending: Emergency Medicine | Admitting: Emergency Medicine

## 2022-08-18 DIAGNOSIS — M79644 Pain in right finger(s): Secondary | ICD-10-CM | POA: Diagnosis not present

## 2022-08-18 DIAGNOSIS — I10 Essential (primary) hypertension: Secondary | ICD-10-CM | POA: Insufficient documentation

## 2022-08-18 DIAGNOSIS — M79601 Pain in right arm: Secondary | ICD-10-CM

## 2022-08-18 DIAGNOSIS — M7989 Other specified soft tissue disorders: Secondary | ICD-10-CM | POA: Insufficient documentation

## 2022-08-18 NOTE — ED Provider Notes (Signed)
   [facility] Provider Note    Event Date/Time   First MD Initiated Contact with Patient 08/18/22 1118     (approximate)   History   Arm Pain   HPI  Vanessa Wyatt is a 57 y.o. female with history of hypertension and as listed in EMR presents to the emergency department for treatment and evaluation of swelling to the right hand and forearm.  She states that her sister noticed it today.  She is having some pain near the thumb but otherwise just has a sensation of swelling and itching.  She does not believe that she has been bitten by anything.  No history of DVT.  She denies recent lab draw or IV stick..      Physical Exam   Triage Vital Signs: ED Triage Vitals [08/18/22 1031]  Enc Vitals Group     BP (!) 147/105     Pulse Rate 77     Resp 20     Temp 98.5 F (36.9 C)     Temp src      SpO2 96 %     Weight      Height 5\' 3"  (1.6 m)     Head Circumference      Peak Flow      Pain Score 8     Pain Loc      Pain Edu?      Excl. in [place]?     Most recent vital signs: Vitals:   08/18/22 1031  BP: (!) 147/105  Pulse: 77  Resp: 20  Temp: 98.5 F (36.9 C)  SpO2: 96%    General: Awake, no distress.  CV:  Good peripheral perfusion.  Resp:  Normal effort.  Abd:  No distention.  Other:  Mild swelling of the right arm from elbow to fingertips. FROM demonstrated.    ED Results / Procedures / Treatments   Labs (all labs ordered are listed, but only abnormal results are displayed) Labs Reviewed - No data to display   EKG  Not indicated.   RADIOLOGY  Image and radiology report reviewed and interpreted by me. Radiology report consistent with the same.  Venous doppler ultrasound of the right upper extremity is negative for acute findings.  PROCEDURES:  Critical Care performed: No  Procedures   MEDICATIONS ORDERED IN ED:  Medications - No data to display   IMPRESSION / MDM / ASSESSMENT AND PLAN / ED COURSE   I have reviewed  the triage note.  Differential diagnosis includes, but is not limited to, DVT, phlebitis, allergic reaction   Patient's presentation is most consistent with acute complicated illness / injury requiring diagnostic workup.  57 year old female presenting to the emergency department after her sister noticed that she had some mild swelling and redness to the right arm.  Ultrasound is negative for DVT or phlebitis.  She will be advised to rest and elevate the arm and if not improving in the next few days follow up with PCP.      FINAL CLINICAL IMPRESSION(S) / ED DIAGNOSES   Final diagnoses:  Right arm pain     Rx / DC Orders   ED Discharge Orders     None        Note:  This document was prepared using Dragon voice recognition software and may include unintentional dictation errors.   Chinita Pester, FNP 08/19/22 1541    Minna Antis, MD 08/19/22 2213

## 2022-08-18 NOTE — ED Triage Notes (Signed)
Pt to ED for right arm swelling to forearm that started yesterday. Denies known injuries, insect bites.

## 2022-08-18 NOTE — Discharge Instructions (Addendum)
Your ultrasound today does not show a DVT (blood clot) and there is no sign of infection,  If your symptoms do not resolve with rest, please follow-up with your primary care provider.  If anything changes or gets worse return to the emergency department.

## 2022-09-30 IMAGING — CT CT HEAD W/O CM
3 series · 15 of 47 positions shown, 18 images · non-contrast
Comparison: None.

CLINICAL DATA: Headaches for 2 days, initial encounter

EXAM:
CT HEAD WITHOUT CONTRAST
TECHNIQUE: Contiguous axial images were obtained from the base of the skull
through the vertex without intravenous contrast.

[Series 2: head wo · axial · 0.45mm/px · z∈[-122,+3]mm · 9 of 31 slices shown, 12 images]
[im 3/31  brain]
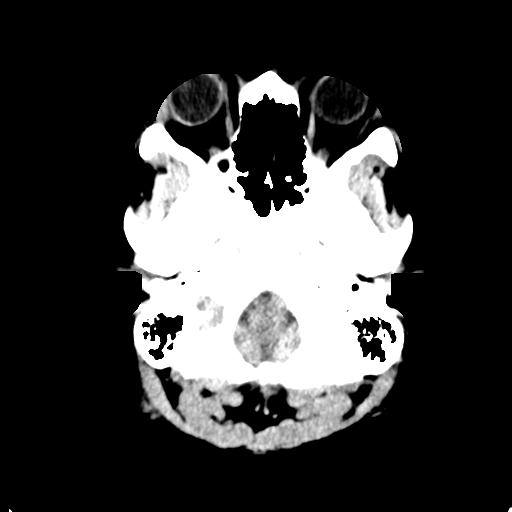
[im 3/31  bone]
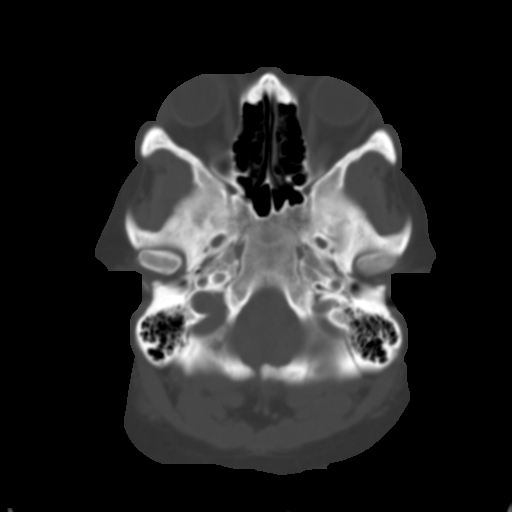
[im 6/31  brain]
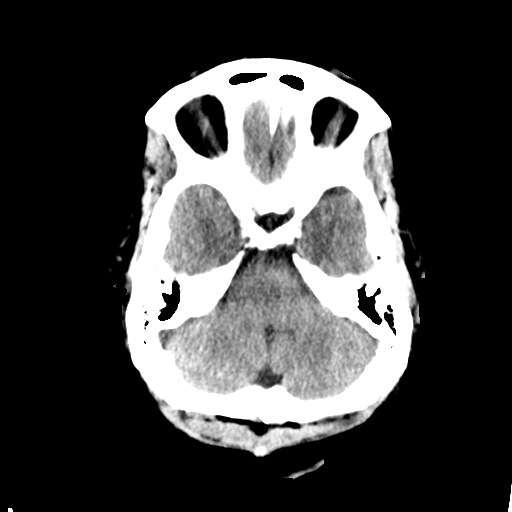
[im 9/31  brain]
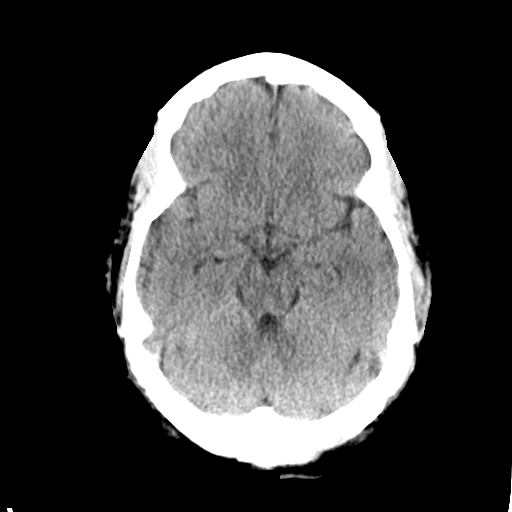
[im 12/31  brain]
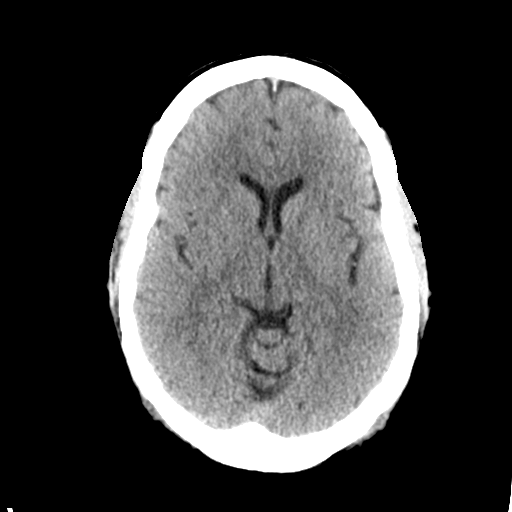
[im 16/31  brain]
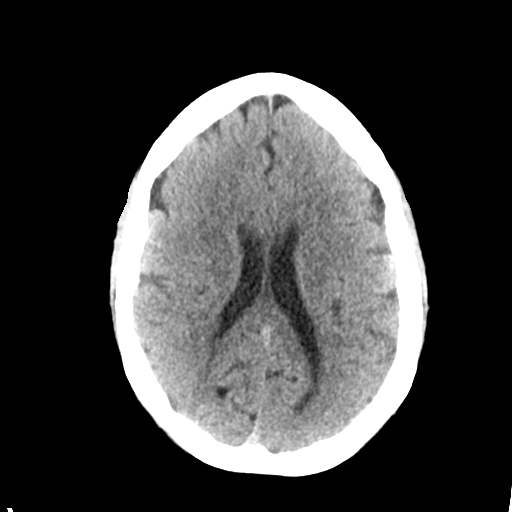
[im 16/31  bone]
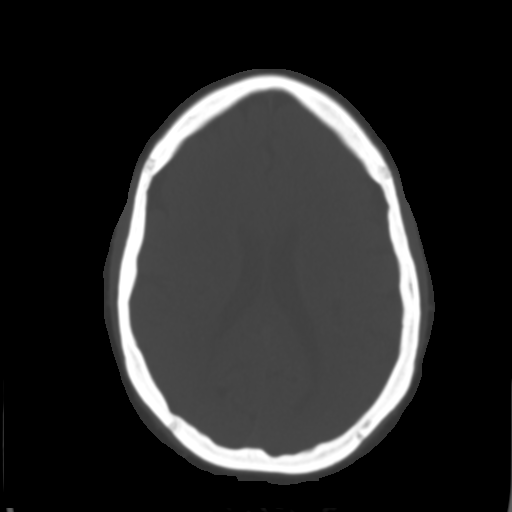
[im 19/31  brain]
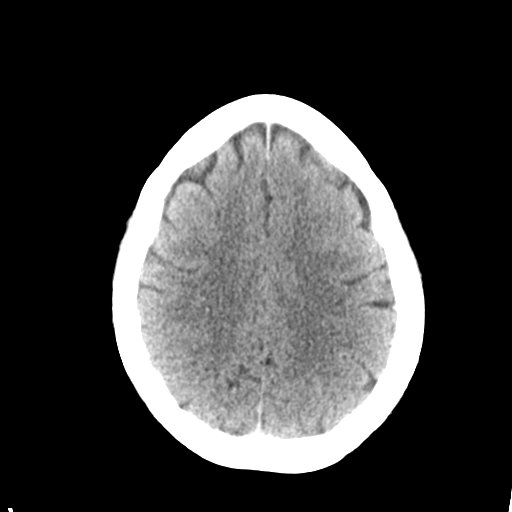
[im 22/31  brain]
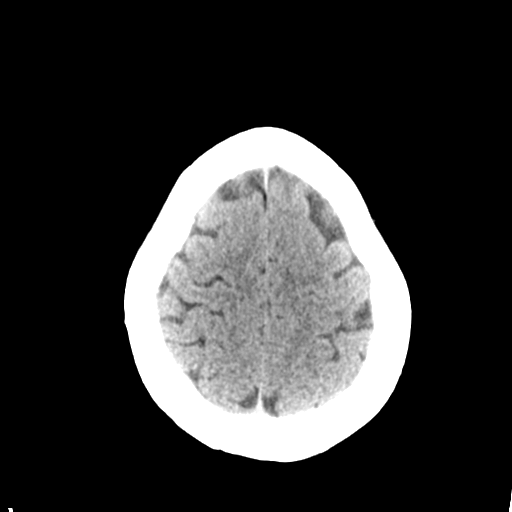
[im 25/31  brain]
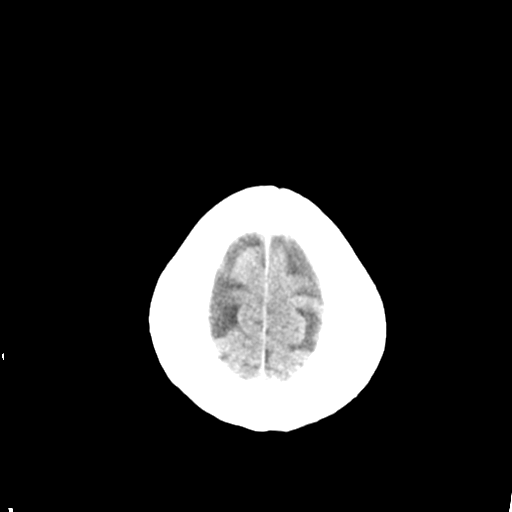
[im 28/31  brain]
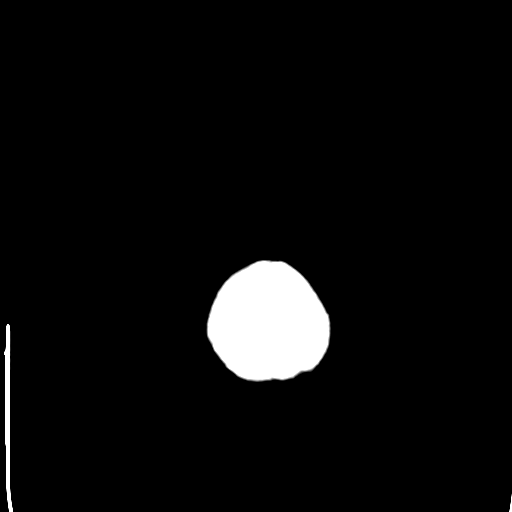
[im 28/31  bone]
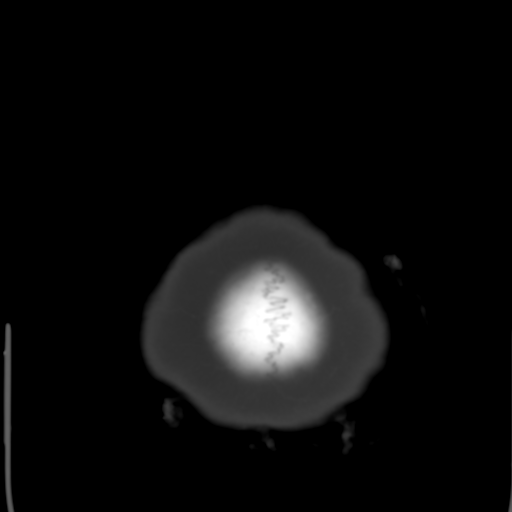

[Series 4: coronal soft tissue · coronal · 0.30mm/px · 3 of 68 slices shown]
[im 23/68  brain]
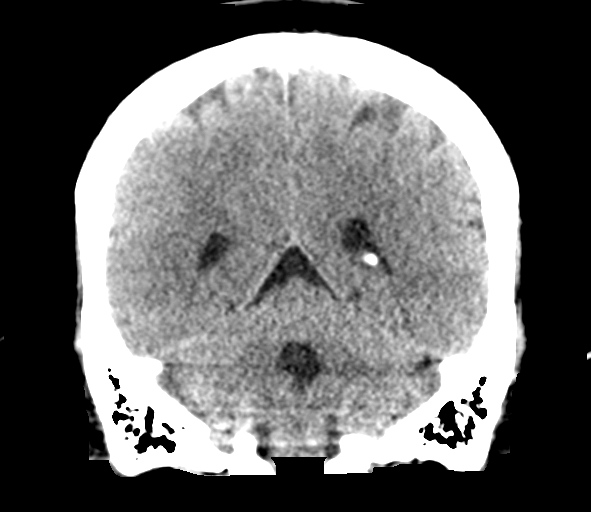
[im 30/68  brain]
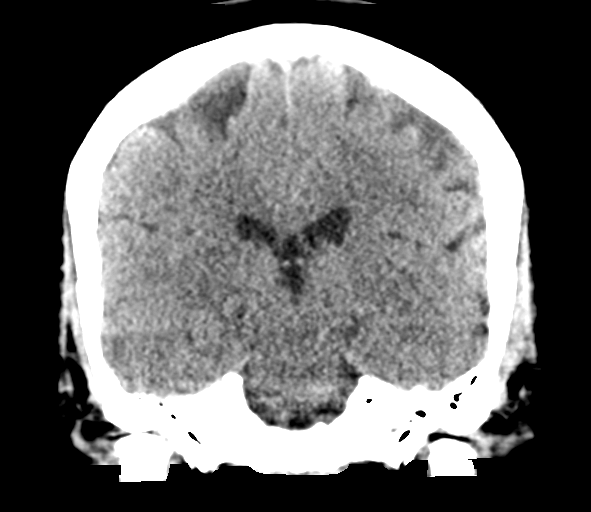
[im 38/68  brain]
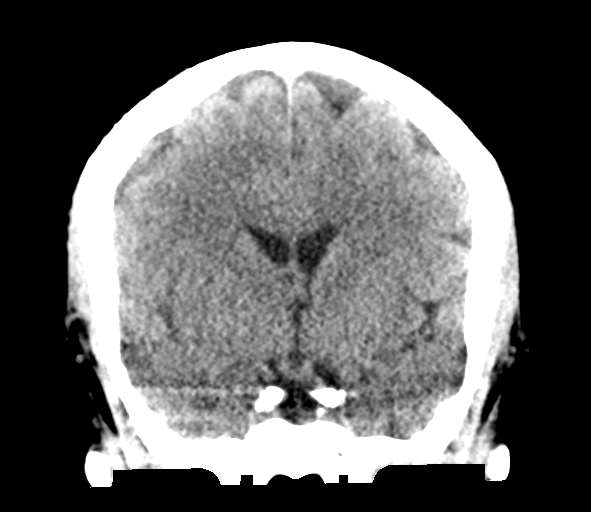

[Series 5: sagittal soft tissue · sagittal · 0.30mm/px · 3 of 61 slices shown]
[im 21/61  brain]
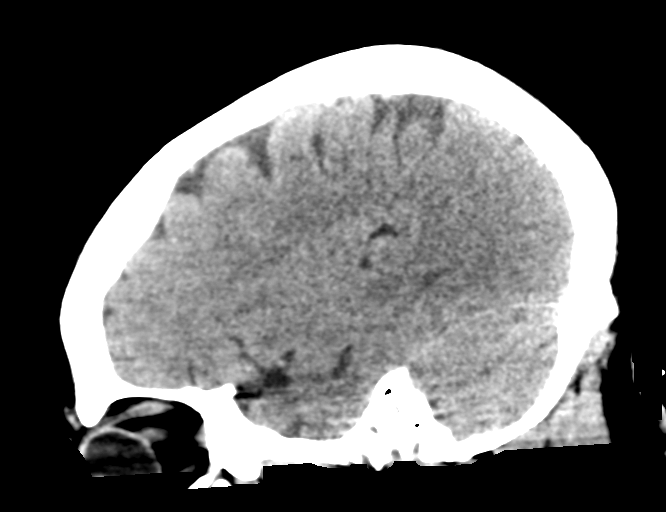
[im 31/61  brain]
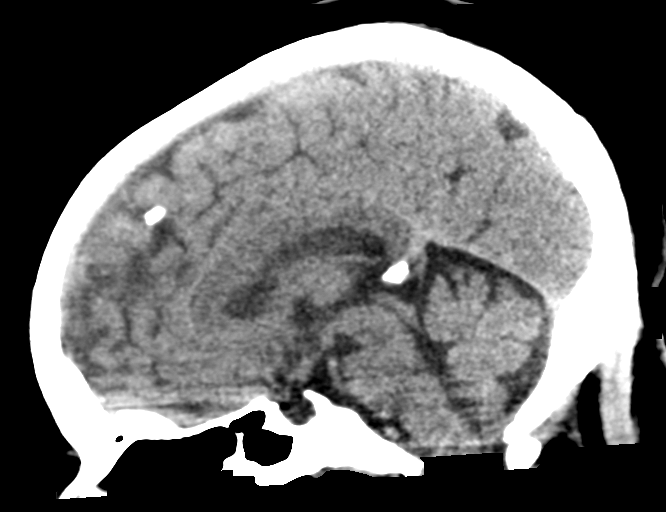
[im 41/61  brain]
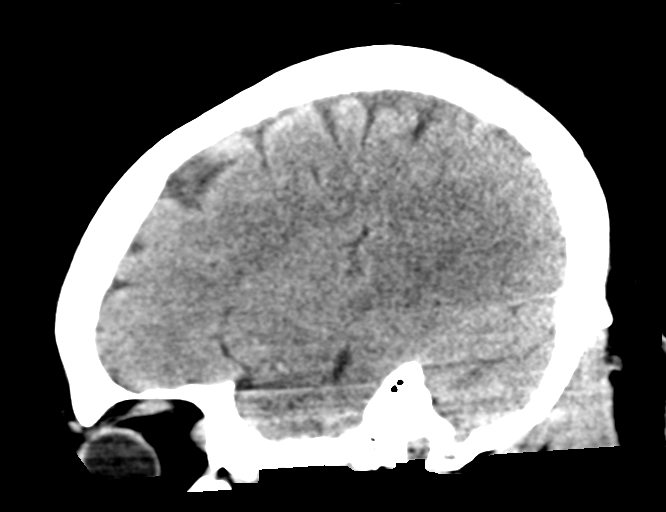

[15 of 47 positions shown; findings below may reference images not displayed]

FINDINGS: Brain: No evidence of acute infarction, hemorrhage, hydrocephalus,
extra-axial collection or mass lesion/mass effect.

Vascular: No hyperdense vessel or unexpected calcification.

Skull: Normal. Negative for fracture or focal lesion.

Sinuses/Orbits: No acute finding.

Other: None.
IMPRESSION: No acute intracranial abnormality noted.

## 2022-12-23 ENCOUNTER — Telehealth: Payer: Self-pay | Admitting: *Deleted

## 2022-12-23 NOTE — Telephone Encounter (Signed)
 LMOVM to verify card hx

## 2022-12-29 ENCOUNTER — Ambulatory Visit: Payer: 59 | Attending: Cardiology | Admitting: Cardiology

## 2022-12-29 ENCOUNTER — Encounter: Payer: Self-pay | Admitting: Cardiology

## 2022-12-29 VITALS — BP 142/84 | HR 56 | Ht 62.0 in | Wt 243.2 lb

## 2022-12-29 DIAGNOSIS — F17200 Nicotine dependence, unspecified, uncomplicated: Secondary | ICD-10-CM | POA: Diagnosis not present

## 2022-12-29 DIAGNOSIS — R0609 Other forms of dyspnea: Secondary | ICD-10-CM

## 2022-12-29 DIAGNOSIS — I10 Essential (primary) hypertension: Secondary | ICD-10-CM | POA: Diagnosis not present

## 2022-12-29 DIAGNOSIS — E7800 Pure hypercholesterolemia, unspecified: Secondary | ICD-10-CM

## 2022-12-29 MED ORDER — SEMAGLUTIDE(0.25 OR 0.5MG/DOS) 2 MG/3ML ~~LOC~~ SOPN: PEN_INJECTOR | SUBCUTANEOUS | 0 refills | Status: DC

## 2022-12-29 MED ORDER — SEMAGLUTIDE(0.25 OR 0.5MG/DOS) 2 MG/3ML ~~LOC~~ SOPN: PEN_INJECTOR | SUBCUTANEOUS | 0 refills | Status: AC

## 2022-12-29 MED ORDER — FUROSEMIDE 20 MG PO TABS: 20.0000 mg | ORAL_TABLET | Freq: Every day | ORAL | 0 refills | Status: DC | PRN

## 2022-12-29 NOTE — Patient Instructions (Signed)
Medication Instructions:   START Lasix - Take one tablet (20mg ) by mouth daily as needed.   2. Start taking Ozempic:  Inject 0.25 MG into skin once a week, for 4 weeks.     Inject 0.50 MG into skin once a week, for 4 weeks. (Call or send [place] a MyChart message when you are on week 2, so we can send your next dose in for you).     Inject 1.0 MG into skin once a week, for 4 weeks.     Inject 2.0 MG into skin once a week, and continue at this dose.    *If you need a refill on your cardiac medications before your next appointment, please call your pharmacy*   Lab Work:  Your provider would like for you to return at the time of your Echocardiogram to have the following labs drawn: BMP.   Please go to [facility] [address] ([facility] [address], [place] [id number] You do not need an appointment.  They are open from 7:30 am-4 pm.  Lunch from 1:00 pm- 2:00 pm You WILL NOT need to be fasting.   If you have labs (blood work) drawn today and your tests are completely normal, you will receive your results only by: MyChart Message (if you have MyChart) OR A paper copy in the mail If you have any lab test that is abnormal or we need to change your treatment, we will call you to review the results.   Testing/Procedures:  Your physician has requested that you have an echocardiogram. Echocardiography is a painless test that uses sound waves to create images of your heart. It provides your doctor with information about the size and shape of your heart and how well your heart's chambers and valves are working. This procedure takes approximately one hour. There are no restrictions for this procedure. Please do NOT wear cologne, perfume, aftershave, or lotions (deodorant is allowed). Please arrive 15 minutes prior to your appointment time.    Follow-Up: At [facility], you and your health needs are our priority.  As part of our continuing mission to provide  you with exceptional heart care, we have created designated Provider Care Teams.  These Care Teams include your primary Cardiologist (physician) and Advanced Practice Providers (APPs -  Physician Assistants and Nurse Practitioners) who all work together to provide you with the care you need, when you need it.  We recommend signing up for the patient portal called "MyChart".  Sign up information is provided on this After Visit Summary.  MyChart is used to connect with patients for Virtual Visits (Telemedicine).  Patients are able to view lab/test results, encounter notes, upcoming appointments, etc.  Non-urgent messages can be sent to your provider as well.   To learn more about what you can do with MyChart, go to [email or web address].    Your next appointment:    After Echocardiogram   Provider:   You may see Debbe Odea, MD or one of the following Advanced Practice Providers on your designated Care Team:   Nicolasa Ducking, NP Eula Listen, PA-C Cadence Fransico Michael, PA-C Charlsie Quest, NP

## 2022-12-29 NOTE — Progress Notes (Signed)
Cardiology Office Note:    Date:  12/29/2022   ID:  Vanessa Wyatt, DOB Apr 06, 1965, MRN [id number]  PCP:  [facility]   [facility] Providers Cardiologist:  Debbe Odea, MD     Referring MD: Dudley Major, FNP   Chief Complaint  Patient presents with   New Patient (Initial Visit)    Referred for cardiac history of dyspnea on exertion and palpitations.  With no known cardiac history.    Vanessa Wyatt is a 57 y.o. female who is being seen today for the evaluation of palpitations at the request of Dudley Major, FNP.   History of Present Illness:    Vanessa Wyatt is a 57 y.o. female with a hx of hypertension, hyperlipidemia, diabetes, current smoker x 30+ years presenting with shortness of breath and edema.  Is having leg edema about a month ago, followed up at an urgent care was given Lasix x 5 days with improvement in edema.  Endorses shortness of breath when she overexerts herself, also has palpitations when she exerts herself.  She denies chest pain.  She works at [employer], endorsed eating salty foods.  Compliant with medications as prescribed.  Past Medical History:  Diagnosis Date   Arthritis    bilateral legs   Diabetes (HCC)    Hyperlipidemia    Hypertension     History reviewed. No pertinent surgical history.  Current Medications: Current Meds  Medication Sig   amLODipine (NORVASC) 5 MG tablet Take 5 mg by mouth daily.   atorvastatin (LIPITOR) 20 MG tablet Take 20 mg by mouth daily.   cetirizine (ZYRTEC) 10 MG tablet Take 10 mg by mouth daily.   cloNIDine (CATAPRES) 0.1 MG tablet Take 1 tablet (0.1 mg total) by mouth 2 (two) times daily.   fluticasone (FLONASE) 50 MCG/ACT nasal spray Place 2 sprays into both nostrils daily.   furosemide (LASIX) 20 MG tablet Take 1 tablet (20 mg total) by mouth daily as needed.   losartan-hydrochlorothiazide (HYZAAR) 100-25 MG tablet Take 1 tablet by mouth daily.   metFORMIN  (GLUCOPHAGE) 500 MG tablet Take 500 mg by mouth 2 (two) times daily with a meal.   potassium chloride SA (KLOR-CON M20) 20 MEQ tablet Take 1 tablet (20 mEq total) by mouth 2 (two) times daily.   Semaglutide,0.25 or 0.5MG /DOS, 2 MG/3ML SOPN Inject 0.25mg  into skin once a week for 28 days.   [START ON 01/26/2023] Semaglutide,0.25 or 0.5MG /DOS, 2 MG/3ML SOPN Inject 0.5mg  into skin once a week for 28 days     Allergies:   Benadryl [diphenhydramine], Citrullus vulgaris, Coconut (cocos nucifera), Flavoring agent, Tomato, and Other   Social History   Socioeconomic History   Marital status: Divorced    Spouse name: Not on file   Number of children: Not on file   Years of education: Not on file   Highest education level: Not on file  Occupational History   Not on file  Tobacco Use   Smoking status: Every Day    Types: Cigarettes   Smokeless tobacco: Never  Vaping Use   Vaping status: Never Used  Substance and Sexual Activity   Alcohol use: No   Drug use: No   Sexual activity: Not on file  Other Topics Concern   Not on file  Social History Narrative   Not on file   Social Determinants of Health   Financial Resource Strain: Not on file  Food Insecurity: Not on file  Transportation Needs:  Not on file  Physical Activity: Not on file  Stress: Not on file  Social Connections: Not on file     Family History: The patient's family history is not on file.  ROS:   Please see the history of present illness.     All other systems reviewed and are negative.  EKGs/Labs/Other Studies Reviewed:    The following studies were reviewed today:  EKG Interpretation Date/Time:  Wednesday December 29 2022 10:58:00 EDT Ventricular Rate:  56 PR Interval:  134 QRS Duration:  72 QT Interval:  380 QTC Calculation: 366 R Axis:   -1  Text Interpretation: Sinus bradycardia Nonspecific ST and T wave abnormality Confirmed by Debbe Odea ([id number]) on 12/29/2022 11:07:32 AM    Recent Labs: No  results found for requested labs within last 365 days.  Recent Lipid Panel No results found for: "CHOL", "TRIG", "HDL", "CHOLHDL", "VLDL", "LDLCALC", "LDLDIRECT"   Risk Assessment/Calculations:     HYPERTENSION CONTROL Vitals:   12/29/22 1052 12/29/22 1055 12/29/22 1100  BP: (!) 140/102 (!) 138/100 (!) 142/84    The patient's blood pressure is elevated above target today.  In order to address the patient's elevated BP: Blood pressure will be monitored at home to determine if medication changes need to be made.            Physical Exam:    VS:  BP (!) 142/84 (BP Location: Left Arm, Patient Position: Sitting, Cuff Size: Large)   Pulse (!) 56   Ht 5\' 2"  (1.575 m)   Wt 243 lb 3.2 oz (110.3 kg)   SpO2 96%   BMI 44.48 kg/m     Wt Readings from Last 3 Encounters:  12/29/22 243 lb 3.2 oz (110.3 kg)  07/02/22 232 lb (105.2 kg)  01/13/22 235 lb (106.6 kg)     GEN:  Well nourished, well developed in no acute distress HEENT: Normal CARDIAC: RRR, no murmurs, rubs, gallops RESPIRATORY:  Clear to auscultation without rales, wheezing or rhonchi  ABDOMEN: Soft, non-tender, distended MUSCULOSKELETAL:  No edema; No deformity  SKIN: Warm and dry NEUROLOGIC:  Alert and oriented x 3 PSYCHIATRIC:  Normal affect   ASSESSMENT:    1. Dyspnea on exertion   2. Primary hypertension   3. Pure hypercholesterolemia   4. Smoking   5. Morbid obesity (HCC)    PLAN:    In order of problems listed above:  Dyspnea on exertion, likely from morbid obesity.  Obtain echo to evaluate any structural abnormalities, okay for Lasix 20 mg daily as needed. Hypertension, lots of salty foods, also a smoker.  Continue current BP meds losartan 100, HCTZ 25, Norvasc 5, clonidine.  Low-salt diet, smoking cessation advised. Hyperlipidemia, continue Lipitor 20 mg daily. Current smoker, smoking cessation advised. Morbid obesity, low-calorie diet.  Patient is a diabetic, start Ozempic.  Follow-up after  echo.     Medication Adjustments/Labs and Tests Ordered: Current medicines are reviewed at length with the patient today.  Concerns regarding medicines are outlined above.  Orders Placed This Encounter  Procedures   Basic Metabolic Panel (BMET)   EKG 12-Lead   ECHOCARDIOGRAM COMPLETE   Meds ordered this encounter  Medications   Semaglutide,0.25 or 0.5MG /DOS, 2 MG/3ML SOPN    Sig: Inject 0.25mg  into skin once a week for 28 days.    Dispense:  3 mL    Refill:  0   Semaglutide,0.25 or 0.5MG /DOS, 2 MG/3ML SOPN    Sig: Inject 0.5mg  into skin once a week for 28  days    Dispense:  3 mL    Refill:  0   furosemide (LASIX) 20 MG tablet    Sig: Take 1 tablet (20 mg total) by mouth daily as needed.    Dispense:  90 tablet    Refill:  0    Patient Instructions  Medication Instructions:   START Lasix - Take one tablet (20mg ) by mouth daily as needed.   2. Start taking Ozempic:  Inject 0.25 MG into skin once a week, for 4 weeks.     Inject 0.50 MG into skin once a week, for 4 weeks. (Call or send [place] a MyChart message when you are on week 2, so we can send your next dose in for you).     Inject 1.0 MG into skin once a week, for 4 weeks.     Inject 2.0 MG into skin once a week, and continue at this dose.    *If you need a refill on your cardiac medications before your next appointment, please call your pharmacy*   Lab Work:  Your provider would like for you to return at the time of your Echocardiogram to have the following labs drawn: BMP.   Please go to [facility] [address] ([facility] [address], [place] [address] You do not need an appointment.  They are open from 7:30 am-4 pm.  Lunch from 1:00 pm- 2:00 pm You WILL NOT need to be fasting.   If you have labs (blood work) drawn today and your tests are completely normal, you will receive your results only by: MyChart Message (if you have MyChart) OR A paper copy in the mail If you have any lab  test that is abnormal or we need to change your treatment, we will call you to review the results.   Testing/Procedures:  Your physician has requested that you have an echocardiogram. Echocardiography is a painless test that uses sound waves to create images of your heart. It provides your doctor with information about the size and shape of your heart and how well your heart's chambers and valves are working. This procedure takes approximately one hour. There are no restrictions for this procedure. Please do NOT wear cologne, perfume, aftershave, or lotions (deodorant is allowed). Please arrive 15 minutes prior to your appointment time.    Follow-Up: At [facility], you and your health needs are our priority.  As part of our continuing mission to provide you with exceptional heart care, we have created designated Provider Care Teams.  These Care Teams include your primary Cardiologist (physician) and Advanced Practice Providers (APPs -  Physician Assistants and Nurse Practitioners) who all work together to provide you with the care you need, when you need it.  We recommend signing up for the patient portal called "MyChart".  Sign up information is provided on this After Visit Summary.  MyChart is used to connect with patients for Virtual Visits (Telemedicine).  Patients are able to view lab/test results, encounter notes, upcoming appointments, etc.  Non-urgent messages can be sent to your provider as well.   To learn more about what you can do with MyChart, go to [email or web address].    Your next appointment:    After Echocardiogram   Provider:   You may see Debbe Odea, MD or one of the following Advanced Practice Providers on your designated Care Team:   Nicolasa Ducking, NP Eula Listen, PA-C Cadence Fransico Michael, PA-C Charlsie Quest, NP   Signed, Debbe Odea, MD  12/29/2022 11:59 AM    [facility]

## 2022-12-30 ENCOUNTER — Telehealth: Payer: Self-pay | Admitting: Pharmacy Technician

## 2022-12-30 ENCOUNTER — Other Ambulatory Visit (HOSPITAL_COMMUNITY): Payer: Self-pay

## 2022-12-30 NOTE — Telephone Encounter (Signed)
Pharmacy Patient Advocate Encounter   Received notification from CoverMyMeds that prior authorization for ozempic is required/requested.   Insurance verification completed.   The patient is insured through [employer] [facility] .   Per test claim: PA required; PA submitted to [employer] [facility] via CoverMyMeds Key/confirmation #/EOC [id number] Status is pending

## 2022-12-31 ENCOUNTER — Other Ambulatory Visit (HOSPITAL_COMMUNITY): Payer: Self-pay

## 2023-01-03 NOTE — Telephone Encounter (Signed)
Pharmacy Patient Advocate Encounter  Received notification from [employer] [facility] that Prior Authorization for ozempic has been DENIED.  Full denial letter will be uploaded to the media tab. See denial reason below.    PA #/Case ID/Reference #: [id number]

## 2023-01-25 ENCOUNTER — Ambulatory Visit: Payer: 59

## 2023-02-09 ENCOUNTER — Ambulatory Visit: Payer: 59 | Admitting: Cardiology

## 2023-08-04 ENCOUNTER — Encounter: Payer: Self-pay | Admitting: Cardiology

## 2023-08-04 ENCOUNTER — Ambulatory Visit (INDEPENDENT_AMBULATORY_CARE_PROVIDER_SITE_OTHER): Admitting: Cardiology

## 2023-08-04 VITALS — BP 160/100 | HR 83 | Ht 62.0 in | Wt 247.0 lb

## 2023-08-04 DIAGNOSIS — E119 Type 2 diabetes mellitus without complications: Secondary | ICD-10-CM

## 2023-08-04 DIAGNOSIS — Z1329 Encounter for screening for other suspected endocrine disorder: Secondary | ICD-10-CM

## 2023-08-04 DIAGNOSIS — Z1231 Encounter for screening mammogram for malignant neoplasm of breast: Secondary | ICD-10-CM

## 2023-08-04 DIAGNOSIS — E782 Mixed hyperlipidemia: Secondary | ICD-10-CM

## 2023-08-04 DIAGNOSIS — Z1211 Encounter for screening for malignant neoplasm of colon: Secondary | ICD-10-CM

## 2023-08-04 DIAGNOSIS — I10 Essential (primary) hypertension: Secondary | ICD-10-CM | POA: Diagnosis not present

## 2023-08-04 DIAGNOSIS — Z7689 Persons encountering health services in other specified circumstances: Secondary | ICD-10-CM | POA: Diagnosis not present

## 2023-08-04 DIAGNOSIS — Z713 Dietary counseling and surveillance: Secondary | ICD-10-CM | POA: Insufficient documentation

## 2023-08-04 MED ORDER — ATORVASTATIN CALCIUM 20 MG PO TABS: 20.0000 mg | ORAL_TABLET | Freq: Every day | ORAL | 3 refills | Status: DC

## 2023-08-04 MED ORDER — FUROSEMIDE 20 MG PO TABS: 20.0000 mg | ORAL_TABLET | Freq: Every day | ORAL | 0 refills | Status: AC | PRN

## 2023-08-04 MED ORDER — CETIRIZINE HCL 10 MG PO TABS: 10.0000 mg | ORAL_TABLET | Freq: Every day | ORAL | 3 refills | Status: DC

## 2023-08-04 MED ORDER — FLUTICASONE PROPIONATE 50 MCG/ACT NA SUSP: 2.0000 | Freq: Every day | NASAL | 0 refills | Status: AC

## 2023-08-04 MED ORDER — METFORMIN HCL 500 MG PO TABS: 500.0000 mg | ORAL_TABLET | Freq: Two times a day (BID) | ORAL | 3 refills | Status: DC

## 2023-08-04 MED ORDER — LOSARTAN POTASSIUM-HCTZ 50-12.5 MG PO TABS: 1.0000 | ORAL_TABLET | Freq: Every day | ORAL | 3 refills | Status: DC

## 2023-08-04 NOTE — Progress Notes (Signed)
 New Patient Office Visit  Subjective   Patient ID: Vanessa Wyatt, female    DOB: Jun 30, 1965  Age: 58 y.o. MRN: [id number]  CC:  Chief Complaint  Patient presents with   Establish Care    NPE    HPI Vanessa Wyatt presents to establish care Previous Primary Care provider/office:   she does not have additional concerns to discuss today.   Patient in office to establish care. Patient has not taken any medications for the past 3-4 months.  Blood pressure elevated today, will restart Hyzaar at a lower dose, titrate dose up as tolerated.  Known diabetes, will restart metformin. Known hyperlipidemia, will restart atorvastatin.  Due for mammogram, will send order. Due for colon cancer screening, will send referral.  Fasting today, will get blood work.     Outpatient Encounter Medications as of 08/04/2023  Medication Sig   acetaminophen  (TYLENOL ) 650 MG CR tablet Take 650 mg by mouth every 8 (eight) hours as needed for pain.   losartan-hydrochlorothiazide (HYZAAR) 50-12.5 MG tablet Take 1 tablet by mouth daily.   atorvastatin (LIPITOR) 20 MG tablet Take 1 tablet (20 mg total) by mouth daily.   cetirizine (ZYRTEC) 10 MG tablet Take 1 tablet (10 mg total) by mouth daily.   fluticasone  (FLONASE ) 50 MCG/ACT nasal spray Place 2 sprays into both nostrils daily.   furosemide  (LASIX ) 20 MG tablet Take 1 tablet (20 mg total) by mouth daily as needed.   metFORMIN (GLUCOPHAGE) 500 MG tablet Take 1 tablet (500 mg total) by mouth 2 (two) times daily with a meal.   [DISCONTINUED] amLODipine (NORVASC) 5 MG tablet Take 5 mg by mouth daily. (Patient not taking: Reported on 08/04/2023)   [DISCONTINUED] amoxicillin -clavulanate (AUGMENTIN ) 875-125 MG tablet Take 1 tablet by mouth 2 (two) times daily. (Patient not taking: Reported on 08/04/2023)   [DISCONTINUED] atorvastatin (LIPITOR) 20 MG tablet Take 20 mg by mouth daily. (Patient not taking: Reported on 08/04/2023)   [DISCONTINUED] cetirizine (ZYRTEC) 10  MG tablet Take 10 mg by mouth daily. (Patient not taking: Reported on 08/04/2023)   [DISCONTINUED] cloNIDine  (CATAPRES ) 0.1 MG tablet Take 1 tablet (0.1 mg total) by mouth 2 (two) times daily.   [DISCONTINUED] fluticasone  (FLONASE ) 50 MCG/ACT nasal spray Place 2 sprays into both nostrils daily. (Patient not taking: Reported on 08/04/2023)   [DISCONTINUED] furosemide  (LASIX ) 20 MG tablet Take 1 tablet (20 mg total) by mouth daily as needed. (Patient not taking: Reported on 08/04/2023)   [DISCONTINUED] losartan-hydrochlorothiazide (HYZAAR) 100-25 MG tablet Take 1 tablet by mouth daily. (Patient not taking: Reported on 08/04/2023)   [DISCONTINUED] metFORMIN (GLUCOPHAGE) 500 MG tablet Take 500 mg by mouth 2 (two) times daily with a meal. (Patient not taking: Reported on 08/04/2023)   [DISCONTINUED] potassium chloride  SA (KLOR-CON  M20) 20 MEQ tablet Take 1 tablet (20 mEq total) by mouth 2 (two) times daily. (Patient not taking: Reported on 08/04/2023)   [DISCONTINUED] predniSONE  (DELTASONE ) 20 MG tablet Take 2 tablets once a day for 7 days (Patient not taking: Reported on 08/04/2023)   [DISCONTINUED] Semaglutide ,0.25 or 0.5MG /DOS, 2 MG/3ML SOPN Inject 0.5mg  into skin once a week for 28 days (Patient not taking: Reported on 08/04/2023)   No facility-administered encounter medications on file as of 08/04/2023.    Past Medical History:  Diagnosis Date   Arthritis    bilateral legs   Diabetes (HCC)    Hyperlipidemia    Hypertension     No past surgical history on file.  No family history  on file.  Social History   Socioeconomic History   Marital status: Divorced    Spouse name: Not on file   Number of children: Not on file   Years of education: Not on file   Highest education level: Not on file  Occupational History   Not on file  Tobacco Use   Smoking status: Every Day    Types: Cigarettes   Smokeless tobacco: Never  Vaping Use   Vaping status: Never Used  Substance and Sexual Activity   Alcohol  use: No   Drug use: No   Sexual activity: Not on file  Other Topics Concern   Not on file  Social History Narrative   Not on file   Social Drivers of Health   Financial Resource Strain: Not on file  Food Insecurity: Not on file  Transportation Needs: Not on file  Physical Activity: Not on file  Stress: Not on file  Social Connections: Not on file  Intimate Partner Violence: Not on file    Review of Systems  Constitutional: Negative.   HENT: Negative.    Eyes: Negative.   Respiratory: Negative.  Negative for shortness of breath.   Cardiovascular: Negative.  Negative for chest pain.  Gastrointestinal: Negative.  Negative for abdominal pain, constipation and diarrhea.  Genitourinary: Negative.   Musculoskeletal:  Negative for joint pain and myalgias.  Skin: Negative.   Neurological: Negative.  Negative for dizziness and headaches.  Endo/Heme/Allergies: Negative.   All other systems reviewed and are negative.       Objective   BP (!) 160/100   Pulse 83   Ht 5\' 2"  (1.575 m)   Wt 247 lb (112 kg)   SpO2 95%   BMI 45.18 kg/m   Physical Exam Vitals and nursing note reviewed.  Constitutional:      Appearance: Normal appearance. She is normal weight.  HENT:     Head: Normocephalic and atraumatic.     Nose: Nose normal.     Mouth/Throat:     Mouth: Mucous membranes are moist.  Eyes:     Extraocular Movements: Extraocular movements intact.     Conjunctiva/sclera: Conjunctivae normal.     Pupils: Pupils are equal, round, and reactive to light.  Cardiovascular:     Rate and Rhythm: Normal rate and regular rhythm.     Pulses: Normal pulses.     Heart sounds: Normal heart sounds.  Pulmonary:     Effort: Pulmonary effort is normal.     Breath sounds: Normal breath sounds.  Abdominal:     General: Abdomen is flat. Bowel sounds are normal.     Palpations: Abdomen is soft.  Musculoskeletal:        General: Normal range of motion.     Cervical back: Normal range of  motion.  Skin:    General: Skin is warm and dry.  Neurological:     General: No focal deficit present.     Mental Status: She is alert and oriented to person, place, and time.  Psychiatric:        Mood and Affect: Mood normal.        Behavior: Behavior normal.        Thought Content: Thought content normal.        Judgment: Judgment normal.        Assessment & Plan:  Start Hyzaar Start metformin Start atorvastatin Mammogram order sent Colonoscopy referral sent Fasting lab work today  Problem List Items Addressed This Visit  Cardiovascular and Mediastinum   Primary hypertension - Primary   Relevant Medications   losartan-hydrochlorothiazide (HYZAAR) 50-12.5 MG tablet   atorvastatin (LIPITOR) 20 MG tablet   furosemide  (LASIX ) 20 MG tablet   Other Relevant Orders   CMP14+EGFR   CBC with Differential/Platelet     Endocrine   Diabetes mellitus without complication (HCC)   Relevant Medications   losartan-hydrochlorothiazide (HYZAAR) 50-12.5 MG tablet   metFORMIN (GLUCOPHAGE) 500 MG tablet   atorvastatin (LIPITOR) 20 MG tablet   Other Relevant Orders   Hemoglobin A1c   CBC with Differential/Platelet     Other   Mixed hyperlipidemia   Relevant Medications   losartan-hydrochlorothiazide (HYZAAR) 50-12.5 MG tablet   atorvastatin (LIPITOR) 20 MG tablet   furosemide  (LASIX ) 20 MG tablet   Other Relevant Orders   Lipid Profile   Encounter to establish care   Other Visit Diagnoses       Thyroid disorder screening       Relevant Orders   TSH     Colon cancer screening       Relevant Orders   Ambulatory referral to Gastroenterology     Breast cancer screening by mammogram       Relevant Orders   MM 3D SCREENING MAMMOGRAM BILATERAL BREAST       Return in about 4 weeks (around 09/01/2023).   Total time spent: 25 minutes  [employer], NP  08/04/2023   This document may have been prepared by Dragon Voice Recognition software and as such may include  unintentional dictation errors.

## 2023-08-05 LAB — LIPID PANEL
Chol/HDL Ratio: 5.3 ratio — ABNORMAL HIGH (ref 0.0–4.4)
Cholesterol, Total: 206 mg/dL — ABNORMAL HIGH (ref 100–199)
HDL: 39 mg/dL — ABNORMAL LOW (ref >39)
LDL Chol Calc (NIH): 137 mg/dL — ABNORMAL HIGH (ref 0–99)
Triglycerides: 166 mg/dL — ABNORMAL HIGH (ref 0–149)
VLDL Cholesterol Cal: 30 mg/dL (ref 5–40)

## 2023-08-05 LAB — CBC WITH DIFFERENTIAL/PLATELET
Basophils Absolute: 0 10*3/uL (ref 0.0–0.2)
Basos: 1 %
EOS (ABSOLUTE): 0.2 10*3/uL (ref 0.0–0.4)
Eos: 2 %
Hematocrit: 39.7 % (ref 34.0–46.6)
Hemoglobin: 13 g/dL (ref 11.1–15.9)
Immature Grans (Abs): 0 10*3/uL (ref 0.0–0.1)
Immature Granulocytes: 0 %
Lymphocytes Absolute: 3.4 10*3/uL — ABNORMAL HIGH (ref 0.7–3.1)
Lymphs: 44 %
MCH: 28.6 pg (ref 26.6–33.0)
MCHC: 32.7 g/dL (ref 31.5–35.7)
MCV: 87 fL (ref 79–97)
Monocytes Absolute: 0.4 10*3/uL (ref 0.1–0.9)
Monocytes: 5 %
Neutrophils Absolute: 3.7 10*3/uL (ref 1.4–7.0)
Neutrophils: 48 %
Platelets: 302 10*3/uL (ref 150–450)
RBC: 4.55 x10E6/uL (ref 3.77–5.28)
RDW: 14.7 % (ref 11.7–15.4)
WBC: 7.7 10*3/uL (ref 3.4–10.8)

## 2023-08-05 LAB — CMP14+EGFR
ALT: 28 IU/L (ref 0–32)
AST: 21 IU/L (ref 0–40)
Albumin: 4.3 g/dL (ref 3.8–4.9)
Alkaline Phosphatase: 75 IU/L (ref 44–121)
BUN/Creatinine Ratio: 15 (ref 9–23)
BUN: 17 mg/dL (ref 6–24)
Bilirubin Total: <0.2 mg/dL (ref 0.0–1.2)
CO2: 23 mmol/L (ref 20–29)
Calcium: 9.8 mg/dL (ref 8.7–10.2)
Chloride: 100 mmol/L (ref 96–106)
Creatinine, Ser: 1.12 mg/dL — ABNORMAL HIGH (ref 0.57–1.00)
Globulin, Total: 3 g/dL (ref 1.5–4.5)
Glucose: 138 mg/dL — ABNORMAL HIGH (ref 70–99)
Potassium: 4.5 mmol/L (ref 3.5–5.2)
Sodium: 139 mmol/L (ref 134–144)
Total Protein: 7.3 g/dL (ref 6.0–8.5)
eGFR: 58 mL/min/{1.73_m2} — ABNORMAL LOW (ref >59)

## 2023-08-05 LAB — HEMOGLOBIN A1C
Est. average glucose Bld gHb Est-mCnc: 174 mg/dL
Hgb A1c MFr Bld: 7.7 % — ABNORMAL HIGH (ref 4.8–5.6)

## 2023-08-05 LAB — TSH: TSH: 1.65 u[IU]/mL (ref 0.450–4.500)

## 2023-09-01 ENCOUNTER — Ambulatory Visit: Admitting: Cardiology

## 2023-09-01 ENCOUNTER — Encounter: Payer: Self-pay | Admitting: Cardiology

## 2023-09-01 VITALS — BP 128/78 | HR 77 | Ht 62.0 in | Wt 242.0 lb

## 2023-09-01 DIAGNOSIS — E66813 Obesity, class 3: Secondary | ICD-10-CM

## 2023-09-01 DIAGNOSIS — E782 Mixed hyperlipidemia: Secondary | ICD-10-CM | POA: Diagnosis not present

## 2023-09-01 DIAGNOSIS — Z713 Dietary counseling and surveillance: Secondary | ICD-10-CM | POA: Diagnosis not present

## 2023-09-01 DIAGNOSIS — I10 Essential (primary) hypertension: Secondary | ICD-10-CM

## 2023-09-01 DIAGNOSIS — Z6841 Body Mass Index (BMI) 40.0 and over, adult: Secondary | ICD-10-CM

## 2023-09-01 DIAGNOSIS — E119 Type 2 diabetes mellitus without complications: Secondary | ICD-10-CM | POA: Diagnosis not present

## 2023-09-01 DIAGNOSIS — G43809 Other migraine, not intractable, without status migrainosus: Secondary | ICD-10-CM

## 2023-09-01 MED ORDER — SUMATRIPTAN SUCCINATE 25 MG PO TABS: 25.0000 mg | ORAL_TABLET | Freq: Once | ORAL | 2 refills | Status: AC | PRN

## 2023-09-01 MED ORDER — MOUNJARO 2.5 MG/0.5ML ~~LOC~~ SOAJ
2.5000 mg | SUBCUTANEOUS | 4 refills | Status: DC
Start: 2023-09-01 — End: 2023-10-13

## 2023-09-01 NOTE — Progress Notes (Unsigned)
 Established Patient Office Visit  Subjective:  Patient ID: Vanessa Wyatt, female    DOB: [birth year]  Age: 58 y.o. MRN: [id number]  Chief Complaint  Patient presents with   Follow-up    4 Weeks Follow Up    Patient in office for 4 week follow up. Patient complains of a headache associated with ringing in her ears, photophobia. Ibuprofen  helped temporarily. Blood pressure normal today.  Discussed option including daily medication and abortive medication. Patient requesting abortive medication, will send in Imitrex. Patient wanting to discuss weight loss assistance. Patient is diabetic, will send in Mounjaro. The patient is asked to make an attempt to improve diet and exercise patterns to aid in medical management of this problem. Discussed recent lab work. Patient had been off her medications prior to blood work, has since restarted them.   Headache  This is a new problem. The current episode started in the past 7 days. The problem occurs constantly. The problem has been waxing and waning. The pain is located in the Frontal region. The pain does not radiate. The quality of the pain is described as squeezing. The pain is at a severity of 8/10. The pain is moderate. Associated symptoms include tinnitus. Pertinent negatives include no abdominal pain, dizziness, nausea or vomiting. The symptoms are aggravated by bright light. She has tried NSAIDs for the symptoms. The treatment provided mild relief. Her past medical history is significant for hypertension.    No other concerns at this time.   Past Medical History:  Diagnosis Date   Arthritis    bilateral legs   Diabetes (HCC)    Hyperlipidemia    Hypertension     History reviewed. No pertinent surgical history.  Social History   Socioeconomic History   Marital status: Divorced    Spouse name: Not on file   Number of children: Not on file   Years of education: Not on file   Highest education level: Not on file  Occupational  History   Not on file  Tobacco Use   Smoking status: Every Day    Types: Cigarettes   Smokeless tobacco: Never  Vaping Use   Vaping status: Never Used  Substance and Sexual Activity   Alcohol use: No   Drug use: No   Sexual activity: Not on file  Other Topics Concern   Not on file  Social History Narrative   Not on file   Social Drivers of Health   Financial Resource Strain: Not on file  Food Insecurity: Not on file  Transportation Needs: Not on file  Physical Activity: Not on file  Stress: Not on file  Social Connections: Not on file  Intimate Partner Violence: Not on file    History reviewed. No pertinent family history.  Allergies  Allergen Reactions   Benadryl [Diphenhydramine] Anaphylaxis   Citrullus Vulgaris Anaphylaxis   Coconut (Cocos Nucifera) Anaphylaxis   Flavoring Agent (Non-Screening) Anaphylaxis   Tomato Anaphylaxis   Other Swelling    Berries, watermelon, coconut    Outpatient Medications Prior to Visit  Medication Sig   acetaminophen  (TYLENOL ) 650 MG CR tablet Take 650 mg by mouth every 8 (eight) hours as needed for pain.   atorvastatin  (LIPITOR) 20 MG tablet Take 1 tablet (20 mg total) by mouth daily.   cetirizine  (ZYRTEC ) 10 MG tablet Take 1 tablet (10 mg total) by mouth daily.   fluticasone  (FLONASE ) 50 MCG/ACT nasal spray Place 2 sprays into both nostrils daily.   furosemide  (LASIX ) 20 MG  tablet Take 1 tablet (20 mg total) by mouth daily as needed.   losartan -hydrochlorothiazide (HYZAAR) 50-12.5 MG tablet Take 1 tablet by mouth daily.   metFORMIN  (GLUCOPHAGE ) 500 MG tablet Take 1 tablet (500 mg total) by mouth 2 (two) times daily with a meal.   No facility-administered medications prior to visit.    Review of Systems  Constitutional: Negative.   HENT:  Positive for tinnitus.   Eyes: Negative.   Respiratory: Negative.  Negative for shortness of breath.   Cardiovascular: Negative.  Negative for chest pain.  Gastrointestinal: Negative.   Negative for abdominal pain, constipation, diarrhea, nausea and vomiting.  Genitourinary: Negative.   Musculoskeletal:  Negative for joint pain and myalgias.  Skin: Negative.   Neurological:  Positive for headaches. Negative for dizziness.  Endo/Heme/Allergies: Negative.   All other systems reviewed and are negative.      Objective:   BP 128/78 (BP Location: Right Arm, Patient Position: Sitting, Cuff Size: Large)   Pulse 77   Ht 5\' 2"  (1.575 m)   Wt 242 lb (109.8 kg)   SpO2 99%   BMI 44.26 kg/m   Vitals:   09/01/23 1109 09/01/23 1122  BP: (!) 148/86 128/78  Pulse: 77   Height: 5\' 2"  (1.575 m)   Weight: 242 lb (109.8 kg)   SpO2: 99%   BMI (Calculated): 44.25     Physical Exam Vitals and nursing note reviewed.  Constitutional:      Appearance: Normal appearance. She is normal weight.  HENT:     Head: Normocephalic and atraumatic.     Nose: Nose normal.     Mouth/Throat:     Mouth: Mucous membranes are moist.  Eyes:     Extraocular Movements: Extraocular movements intact.     Conjunctiva/sclera: Conjunctivae normal.     Pupils: Pupils are equal, round, and reactive to light.  Cardiovascular:     Rate and Rhythm: Normal rate and regular rhythm.     Pulses: Normal pulses.     Heart sounds: Normal heart sounds.  Pulmonary:     Effort: Pulmonary effort is normal.     Breath sounds: Normal breath sounds.  Abdominal:     General: Abdomen is flat. Bowel sounds are normal.     Palpations: Abdomen is soft.  Musculoskeletal:        General: Normal range of motion.     Cervical back: Normal range of motion.  Skin:    General: Skin is warm and dry.  Neurological:     General: No focal deficit present.     Mental Status: She is alert and oriented to person, place, and time.  Psychiatric:        Mood and Affect: Mood normal.        Behavior: Behavior normal.        Thought Content: Thought content normal.        Judgment: Judgment normal.      No results found  for any visits on 09/01/23.  Recent Results (from the past 2160 hours)  Lipid Profile     Status: Abnormal   Collection Time: 08/04/23 11:26 AM  Result Value Ref Range   Cholesterol, Total 206 (H) 100 - 199 mg/dL   Triglycerides 161 (H) 0 - 149 mg/dL   HDL 39 (L) >09 mg/dL   VLDL Cholesterol Cal 30 5 - 40 mg/dL   LDL Chol Calc (NIH) 604 (H) 0 - 99 mg/dL   Chol/HDL Ratio 5.3 (H) 0.0 - 4.4  ratio    Comment:                                   T. Chol/HDL Ratio                                             Men  Women                               1/2 Avg.Risk  3.4    3.3                                   Avg.Risk  5.0    4.4                                2X Avg.Risk  9.6    7.1                                3X Avg.Risk 23.4   11.0   CMP14+EGFR     Status: Abnormal   Collection Time: 08/04/23 11:26 AM  Result Value Ref Range   Glucose 138 (H) 70 - 99 mg/dL   BUN 17 6 - 24 mg/dL   Creatinine, Ser 1.61 (H) 0.57 - 1.00 mg/dL   eGFR 58 (L) >09 UE/AVW/0.98   BUN/Creatinine Ratio 15 9 - 23   Sodium 139 134 - 144 mmol/L   Potassium 4.5 3.5 - 5.2 mmol/L   Chloride 100 96 - 106 mmol/L   CO2 23 20 - 29 mmol/L   Calcium  9.8 8.7 - 10.2 mg/dL   Total Protein 7.3 6.0 - 8.5 g/dL   Albumin 4.3 3.8 - 4.9 g/dL   Globulin, Total 3.0 1.5 - 4.5 g/dL   Bilirubin Total <1.1 0.0 - 1.2 mg/dL   Alkaline Phosphatase 75 44 - 121 IU/L   AST 21 0 - 40 IU/L   ALT 28 0 - 32 IU/L  TSH     Status: None   Collection Time: 08/04/23 11:26 AM  Result Value Ref Range   TSH 1.650 0.450 - 4.500 uIU/mL  Hemoglobin A1c     Status: Abnormal   Collection Time: 08/04/23 11:26 AM  Result Value Ref Range   Hgb A1c MFr Bld 7.7 (H) 4.8 - 5.6 %    Comment:          Prediabetes: 5.7 - 6.4          Diabetes: >6.4          Glycemic control for adults with diabetes: <7.0    Est. average glucose Bld gHb Est-mCnc 174 mg/dL  CBC with Differential/Platelet     Status: Abnormal   Collection Time: 08/04/23 11:26 AM  Result Value  Ref Range   WBC 7.7 3.4 - 10.8 x10E3/uL   RBC 4.55 3.77 - 5.28 x10E6/uL   Hemoglobin 13.0 11.1 - 15.9 g/dL   Hematocrit 91.4 78.2 - 46.6 %   MCV 87 79 - 97 fL   MCH 28.6 26.6 - 33.0 pg   MCHC 32.7 31.5 -  35.7 g/dL   RDW 21.3 08.6 - 57.8 %   Platelets 302 150 - 450 x10E3/uL   Neutrophils 48 Not Estab. %   Lymphs 44 Not Estab. %   Monocytes 5 Not Estab. %   Eos 2 Not Estab. %   Basos 1 Not Estab. %   Neutrophils Absolute 3.7 1.4 - 7.0 x10E3/uL   Lymphocytes Absolute 3.4 (H) 0.7 - 3.1 x10E3/uL   Monocytes Absolute 0.4 0.1 - 0.9 x10E3/uL   EOS (ABSOLUTE) 0.2 0.0 - 0.4 x10E3/uL   Basophils Absolute 0.0 0.0 - 0.2 x10E3/uL   Immature Granulocytes 0 Not Estab. %   Immature Grans (Abs) 0.0 0.0 - 0.1 x10E3/uL      Assessment & Plan:  Imitrex for migraines. Mounjaro for DM and weight loss.  Diet and exercise.   Problem List Items Addressed This Visit       Cardiovascular and Mediastinum   Primary hypertension - Primary     Endocrine   Diabetes mellitus without complication (HCC)   Relevant Medications   tirzepatide (MOUNJARO) 2.5 MG/0.5ML Pen     Other   Mixed hyperlipidemia   Other Visit Diagnoses       Weight loss counseling, encounter for         Class 3 severe obesity due to excess calories with serious comorbidity and body mass index (BMI) of 40.0 to 44.9 in adult       Relevant Medications   tirzepatide (MOUNJARO) 2.5 MG/0.5ML Pen       Return in about 6 weeks (around 10/13/2023).   Total time spent: 25 minutes  [employer], NP  09/01/2023   This document may have been prepared by Dragon Voice Recognition software and as such may include unintentional dictation errors.

## 2023-09-01 NOTE — Patient Instructions (Signed)
 [facility] at [facility] [address], [address] [place],  [place]  [address]  Main: [phone]

## 2023-09-02 DIAGNOSIS — G43909 Migraine, unspecified, not intractable, without status migrainosus: Secondary | ICD-10-CM | POA: Insufficient documentation

## 2023-09-02 DIAGNOSIS — E66813 Obesity, class 3: Secondary | ICD-10-CM | POA: Insufficient documentation

## 2023-09-02 DIAGNOSIS — Z6841 Body Mass Index (BMI) 40.0 and over, adult: Secondary | ICD-10-CM | POA: Insufficient documentation

## 2023-09-06 ENCOUNTER — Telehealth: Payer: Self-pay

## 2023-09-06 NOTE — Telephone Encounter (Signed)
 Patient called about a PA for mounjaro , I havent received one yet so I will have to start it, she states the pharmacy has sent over multiple attempts but non have came to this fax will check cover my meds

## 2023-09-08 NOTE — Telephone Encounter (Signed)
 Received PA Tuesday late afternoon

## 2023-09-19 ENCOUNTER — Telehealth: Payer: Self-pay

## 2023-09-19 NOTE — Telephone Encounter (Signed)
 Patient LM asking for call back about a PA for her mounjaro , I need to look back and see if it was approved or not I think it was denied she needed to try alternative medication

## 2023-10-02 ENCOUNTER — Emergency Department
Admission: EM | Admit: 2023-10-02 | Discharge: 2023-10-02 | Disposition: A | Discharge status: Home / Self Care | Attending: Emergency Medicine | Admitting: Emergency Medicine

## 2023-10-02 ENCOUNTER — Other Ambulatory Visit: Payer: Self-pay

## 2023-10-02 ENCOUNTER — Emergency Department

## 2023-10-02 DIAGNOSIS — R0602 Shortness of breath: Secondary | ICD-10-CM | POA: Diagnosis not present

## 2023-10-02 DIAGNOSIS — E119 Type 2 diabetes mellitus without complications: Secondary | ICD-10-CM | POA: Diagnosis not present

## 2023-10-02 DIAGNOSIS — R519 Headache, unspecified: Secondary | ICD-10-CM | POA: Insufficient documentation

## 2023-10-02 DIAGNOSIS — K920 Hematemesis: Secondary | ICD-10-CM | POA: Insufficient documentation

## 2023-10-02 DIAGNOSIS — I10 Essential (primary) hypertension: Secondary | ICD-10-CM | POA: Insufficient documentation

## 2023-10-02 LAB — COMPREHENSIVE METABOLIC PANEL WITH GFR
ALT: 29 U/L (ref 0–44)
AST: 24 U/L (ref 15–41)
Albumin: 3.7 g/dL (ref 3.5–5.0)
Alkaline Phosphatase: 74 U/L (ref 38–126)
Anion gap: 10 (ref 5–15)
BUN: 19 mg/dL (ref 6–20)
CO2: 27 mmol/L (ref 22–32)
Calcium: 9.1 mg/dL (ref 8.9–10.3)
Chloride: 103 mmol/L (ref 98–111)
Creatinine, Ser: 1.19 mg/dL — ABNORMAL HIGH (ref 0.44–1.00)
GFR, Estimated: 54 mL/min — ABNORMAL LOW (ref >60)
Glucose, Bld: 137 mg/dL — ABNORMAL HIGH (ref 70–99)
Potassium: 3.7 mmol/L (ref 3.5–5.1)
Sodium: 140 mmol/L (ref 135–145)
Total Bilirubin: 0.3 mg/dL (ref 0.0–1.2)
Total Protein: 7 g/dL (ref 6.5–8.1)

## 2023-10-02 LAB — CBC
HCT: 36.3 % (ref 36.0–46.0)
Hemoglobin: 11.7 g/dL — ABNORMAL LOW (ref 12.0–15.0)
MCH: 28.7 pg (ref 26.0–34.0)
MCHC: 32.2 g/dL (ref 30.0–36.0)
MCV: 89 fL (ref 80.0–100.0)
Platelets: 293 K/uL (ref 150–400)
RBC: 4.08 MIL/uL (ref 3.87–5.11)
RDW: 15.5 % (ref 11.5–15.5)
WBC: 8.6 K/uL (ref 4.0–10.5)
nRBC: 0 % (ref 0.0–0.2)

## 2023-10-02 LAB — TROPONIN I (HIGH SENSITIVITY)
Troponin I (High Sensitivity): 4 ng/L (ref <18)
Troponin I (High Sensitivity): 7 ng/L (ref <18)

## 2023-10-02 LAB — LIPASE, BLOOD: Lipase: 53 U/L — ABNORMAL HIGH (ref 11–51)

## 2023-10-02 LAB — MAGNESIUM: Magnesium: 1.7 mg/dL (ref 1.7–2.4)

## 2023-10-02 MED ORDER — PANTOPRAZOLE SODIUM 40 MG IV SOLR
40.0000 mg | Freq: Once | INTRAVENOUS | Status: AC
  Administered 2023-10-02: 40 mg via INTRAVENOUS
  Filled 2023-10-02: qty 10

## 2023-10-02 MED ORDER — PROCHLORPERAZINE EDISYLATE 10 MG/2ML IJ SOLN
10.0000 mg | Freq: Once | INTRAMUSCULAR | Status: AC
  Administered 2023-10-02: 10 mg via INTRAVENOUS
  Filled 2023-10-02: qty 2

## 2023-10-02 MED ORDER — OMEPRAZOLE MAGNESIUM 20 MG PO TBEC: 20.0000 mg | DELAYED_RELEASE_TABLET | Freq: Every day | ORAL | 0 refills | Status: AC

## 2023-10-02 MED ORDER — ONDANSETRON 4 MG PO TBDP: 4.0000 mg | ORAL_TABLET | Freq: Three times a day (TID) | ORAL | 0 refills | Status: DC | PRN

## 2023-10-02 MED ORDER — DEXAMETHASONE SODIUM PHOSPHATE 10 MG/ML IJ SOLN
10.0000 mg | Freq: Once | INTRAMUSCULAR | Status: AC
  Administered 2023-10-02: 10 mg via INTRAVENOUS
  Filled 2023-10-02: qty 1

## 2023-10-02 MED ORDER — SODIUM CHLORIDE 0.9 % IV BOLUS
500.0000 mL | Freq: Once | INTRAVENOUS | Status: AC
  Administered 2023-10-02: 500 mL via INTRAVENOUS

## 2023-10-02 MED ORDER — ACETAMINOPHEN 500 MG PO TABS
1000.0000 mg | ORAL_TABLET | Freq: Once | ORAL | Status: AC
  Administered 2023-10-02: 1000 mg via ORAL
  Filled 2023-10-02: qty 2

## 2023-10-02 MED ORDER — IOHEXOL 350 MG/ML SOLN
75.0000 mL | Freq: Once | INTRAVENOUS | Status: AC | PRN
  Administered 2023-10-02: 75 mL via INTRAVENOUS

## 2023-10-02 NOTE — Discharge Instructions (Signed)
 You are seen in the emergency department for a headache.  You had a CT scan of your head that did not show any obvious causes of your headache.  You were given medication through your IV and this resolved your headache.  Your lab work was overall normal.  You are given information to call and follow-up with neurology as an outpatient, call them on Monday.  Call your primary care physician to schedule close follow-up appointment.  You are given a prescription for nausea medication and an acid reducing medication given that you are having some vomiting that had some blood in it.  Follow-up closely with your primary care physician.  Return if your symptoms return.  zofran  (ondansetron ) - nausea medication, take 1 tablet every 8 hours as needed for nausea/vomiting.

## 2023-10-02 NOTE — ED Triage Notes (Signed)
 Pt comes via EMS with c/o migraine for two weeks. Pt also, vomiting blood and sob. Pt states this all started today.   VSS

## 2023-10-02 NOTE — ED Provider Notes (Signed)
 [facility] Provider Note    Event Date/Time   First MD Initiated Contact with Patient 10/02/23 [phone]     (approximate)   History   Shortness of Breath   HPI  Vanessa Wyatt is a 58 y.o. female past medical history significant for hypertension, diabetes, hyperlipidemia, who presents to the emergency department with a headache.  Patient states that she has been having an ongoing headache for the past 4 weeks.  States that she has been having an ongoing headache that she currently rates as 8/10.  Throbbing headache that has been intermittent.  Associated with photophobia and feeling like there is been spots in her eyes.  Associated with nausea and vomiting.  States that she was evaluated by her primary care physician and started on some medication.  States that she has ongoing symptoms.  States that she has a history of migraines but does not follow with a neurologist.  Denies any falls or trauma.  Not on anticoagulation.  Denies any change in vision at this time.  States that her headache worsened and she started having some blood with vomiting today.  Denies any abdominal pain.  Denies any blood in her stool or melena.  Denies any alcohol use.  Intermittent ibuprofen  use recently.  On chart review patient was prescribed Imitrex  by her primary care provider.  This was on 09/01/2023 patient was also prescribed Mounjaro  -does not appear that this medication was approved for though.     Physical Exam   Triage Vital Signs: ED Triage Vitals  Encounter Vitals Group     BP 10/02/23 0745 (!) 183/95     Girls Systolic BP Percentile --      Girls Diastolic BP Percentile --      Boys Systolic BP Percentile --      Boys Diastolic BP Percentile --      Pulse Rate 10/02/23 0745 79     Resp 10/02/23 0745 (!) 21     Temp 10/02/23 0745 98.6 F (37 C)     Temp src --      SpO2 10/02/23 0745 98 %     Weight 10/02/23 0744 243 lb (110.2 kg)     Height 10/02/23 0744 5' 4  (1.626 m)     Head Circumference --      Peak Flow --      Pain Score 10/02/23 0743 8     Pain Loc --      Pain Education --      Exclude from Growth Chart --     Most recent vital signs: Vitals:   10/02/23 0745 10/02/23 0800  BP: (!) 183/95 (!) 147/82  Pulse: 79 76  Resp: (!) 21 17  Temp: 98.6 F (37 C)   SpO2: 98% 97%    Physical Exam Constitutional:      Appearance: She is well-developed.  HENT:     Head: Atraumatic.  Eyes:     Extraocular Movements: Extraocular movements intact.     Conjunctiva/sclera: Conjunctivae normal.     Pupils: Pupils are equal, round, and reactive to light.  Cardiovascular:     Rate and Rhythm: Regular rhythm.  Pulmonary:     Effort: No respiratory distress.  Abdominal:     General: There is no distension.     Palpations: Abdomen is soft.     Tenderness: There is no abdominal tenderness.  Musculoskeletal:        General: Normal range of motion.  Cervical back: Normal range of motion.     Right lower leg: No edema.     Left lower leg: No edema.     Comments: No unilateral leg swelling  Skin:    General: Skin is warm.  Neurological:     Mental Status: She is alert. Mental status is at baseline.     GCS: GCS eye subscore is 4. GCS verbal subscore is 5. GCS motor subscore is 6.     Cranial Nerves: Cranial nerves 2-12 are intact.     Sensory: Sensation is intact.     Motor: Motor function is intact.     Coordination: Coordination is intact.     Comments: No nystagmus     IMPRESSION / MDM / ASSESSMENT AND PLAN / ED COURSE  I reviewed the triage vital signs and the nursing notes.  Differential diagnosis including migraine headache, intracranial hemorrhage, malignancy, dural venous thrombosis, gastritis/PUD, ACS, anemia, dehydration, hypertensive emergency, press syndrome  Patient treated with IV fluids, Compazine  and Tylenol   EKG  I, Clotilda Punter, the attending physician, personally viewed and interpreted this ECG.   Rate:  Normal  Rhythm: Normal sinus  Axis: Normal  Intervals: Normal  ST&T Change: None  No tachycardic or bradycardic dysrhythmias while on cardiac telemetry.  RADIOLOGY I independently reviewed imaging, my interpretation of imaging: Chest x-ray no signs of pneumonia or pulmonary edema  CTV -no obvious mass.  Unable to completely rule out venous thrombus given poor timing of the contrast bolus.  But no obvious thrombus visualized.  LABS (all labs ordered are listed, but only abnormal results are displayed) Labs interpreted as -    Labs Reviewed  CBC - Abnormal; Notable for the following components:      Result Value   Hemoglobin 11.7 (*)    All other components within normal limits  COMPREHENSIVE METABOLIC PANEL WITH GFR - Abnormal; Notable for the following components:   Glucose, Bld 137 (*)    Creatinine, Ser 1.19 (*)    GFR, Estimated 54 (*)    All other components within normal limits  LIPASE, BLOOD - Abnormal; Notable for the following components:   Lipase 53 (*)    All other components within normal limits  MAGNESIUM   URINALYSIS, W/ REFLEX TO CULTURE (INFECTION SUSPECTED)  TROPONIN I (HIGH SENSITIVITY)  TROPONIN I (HIGH SENSITIVITY)     MDM  On reevaluation patient states she is feeling much better.  No significant leukocytosis.  Creatinine appears to be at her baseline.  Normal BUN.  No significant anemia.  Serial troponins negative, have low suspicion for ACS.  Clinical Course as of 10/02/23 1120  Sun Oct 02, 2023  1005 States that she is feeling much better and her headache has resolved.  No longer with any episodes of nausea or vomiting. [SM]    Clinical Course User Index [SM] Punter Clotilda, MD   No further episodes of nausea or vomiting have low suspicion for significant upper GI bleed.  No melena normal BUN.  Patient states she is feeling much better and wants to go home.  Given a dose of Decadron  to prevent a rebound headache.  Given a referral for neurology  and discussed close follow-up as an outpatient.  Will start on a PPI and antiemetics.  Discussed follow-up with primary care.  Given return precautions for any ongoing or return of symptoms.  Do not feel that MRV is necessary at this time given that the patient does not have an intractable headache and  is feeling much better, no vision changes.  No thunderclap headache, have low suspicion for subarachnoid hemorrhage or RCVS.   PROCEDURES:  Critical Care performed: No  Procedures  Patient's presentation is most consistent with acute presentation with potential threat to life or bodily function.   MEDICATIONS ORDERED IN ED: Medications  acetaminophen  (TYLENOL ) tablet 1,000 mg (1,000 mg Oral Given 10/02/23 0826)  prochlorperazine  (COMPAZINE ) injection 10 mg (10 mg Intravenous Given 10/02/23 0828)  sodium chloride  0.9 % bolus 500 mL (0 mLs Intravenous Stopped 10/02/23 1010)  iohexol  (OMNIPAQUE ) 350 MG/ML injection 75 mL (75 mLs Intravenous Contrast Given 10/02/23 0920)  pantoprazole  (PROTONIX ) injection 40 mg (40 mg Intravenous Given 10/02/23 1013)  dexamethasone  (DECADRON ) injection 10 mg (10 mg Intravenous Given 10/02/23 1047)    FINAL CLINICAL IMPRESSION(S) / ED DIAGNOSES   Final diagnoses:  Acute nonintractable headache, unspecified headache type  Hematemesis with nausea     Rx / DC Orders   ED Discharge Orders     None        Note:  This document was prepared using Dragon voice recognition software and may include unintentional dictation errors.   Suzanne Kirsch, MD 10/02/23 1120

## 2023-10-13 ENCOUNTER — Encounter: Payer: Self-pay | Admitting: Cardiology

## 2023-10-13 ENCOUNTER — Ambulatory Visit (INDEPENDENT_AMBULATORY_CARE_PROVIDER_SITE_OTHER): Admitting: Cardiology

## 2023-10-13 VITALS — BP 127/81 | HR 91 | Ht 62.0 in | Wt 247.2 lb

## 2023-10-13 DIAGNOSIS — Z0130 Encounter for examination of blood pressure without abnormal findings: Secondary | ICD-10-CM

## 2023-10-13 DIAGNOSIS — Z6841 Body Mass Index (BMI) 40.0 and over, adult: Secondary | ICD-10-CM

## 2023-10-13 DIAGNOSIS — E66813 Obesity, class 3: Secondary | ICD-10-CM

## 2023-10-13 DIAGNOSIS — E119 Type 2 diabetes mellitus without complications: Secondary | ICD-10-CM

## 2023-10-13 DIAGNOSIS — Z713 Dietary counseling and surveillance: Secondary | ICD-10-CM

## 2023-10-13 MED ORDER — OZEMPIC (0.25 OR 0.5 MG/DOSE) 2 MG/1.5ML ~~LOC~~ SOPN: 0.2500 mg | PEN_INJECTOR | SUBCUTANEOUS | 4 refills | Status: DC

## 2023-10-13 NOTE — Progress Notes (Signed)
 Established Patient Office Visit  Subjective:  Patient ID: Vanessa Wyatt, female    DOB: 11/24/1965  Age: 58 y.o. MRN: [id number]  Chief Complaint  Patient presents with   Follow-up    Patient in office for 6 week follow up. Patient reports headaches have improved. Continue to have tinnitus. Seeing neurology 10/24/23.  Did not start Mounjaro , insurance did not approve. Will send in Ozempic .  The patient is asked to make an attempt to improve diet and exercise patterns to aid in medical management of this problem.    No other concerns at this time.   Past Medical History:  Diagnosis Date   Arthritis    bilateral legs   Diabetes (HCC)    Hyperlipidemia    Hypertension     History reviewed. No pertinent surgical history.  Social History   Socioeconomic History   Marital status: Divorced    Spouse name: Not on file   Number of children: Not on file   Years of education: Not on file   Highest education level: Not on file  Occupational History   Not on file  Tobacco Use   Smoking status: Every Day    Types: Cigarettes   Smokeless tobacco: Never  Vaping Use   Vaping status: Never Used  Substance and Sexual Activity   Alcohol use: No   Drug use: No   Sexual activity: Not on file  Other Topics Concern   Not on file  Social History Narrative   Not on file   Social Drivers of Health   Financial Resource Strain: Not on file  Food Insecurity: Not on file  Transportation Needs: Not on file  Physical Activity: Not on file  Stress: Not on file  Social Connections: Not on file  Intimate Partner Violence: Not on file    History reviewed. No pertinent family history.  Allergies  Allergen Reactions   Benadryl [Diphenhydramine] Anaphylaxis   Citrullus Vulgaris Anaphylaxis   Coconut (Cocos Nucifera) Anaphylaxis   Flavoring Agent (Non-Screening) Anaphylaxis   Tomato Anaphylaxis   Other Swelling    Berries, watermelon, coconut    Outpatient Medications Prior  to Visit  Medication Sig   acetaminophen  (TYLENOL ) 650 MG CR tablet Take 650 mg by mouth every 8 (eight) hours as needed for pain.   atorvastatin  (LIPITOR) 20 MG tablet Take 1 tablet (20 mg total) by mouth daily.   cetirizine  (ZYRTEC ) 10 MG tablet Take 1 tablet (10 mg total) by mouth daily.   fluticasone  (FLONASE ) 50 MCG/ACT nasal spray Place 2 sprays into both nostrils daily.   furosemide  (LASIX ) 20 MG tablet Take 1 tablet (20 mg total) by mouth daily as needed.   losartan -hydrochlorothiazide (HYZAAR) 50-12.5 MG tablet Take 1 tablet by mouth daily.   metFORMIN  (GLUCOPHAGE ) 500 MG tablet Take 1 tablet (500 mg total) by mouth 2 (two) times daily with a meal.   omeprazole  (PRILOSEC  OTC) 20 MG tablet Take 1 tablet (20 mg total) by mouth daily.   ondansetron  (ZOFRAN -ODT) 4 MG disintegrating tablet Take 1 tablet (4 mg total) by mouth every 8 (eight) hours as needed for nausea or vomiting.   SUMAtriptan  (IMITREX ) 25 MG tablet Take 1 tablet (25 mg total) by mouth once as needed for migraine. May repeat in 2 hours if headache persists or recurs.   [DISCONTINUED] tirzepatide  (MOUNJARO ) 2.5 MG/0.5ML Pen Inject 2.5 mg into the skin once a week. (Patient not taking: Reported on 10/13/2023)   No facility-administered medications prior to visit.  Review of Systems  Constitutional: Negative.   HENT: Negative.    Eyes: Negative.   Respiratory: Negative.  Negative for shortness of breath.   Cardiovascular: Negative.  Negative for chest pain.  Gastrointestinal: Negative.  Negative for abdominal pain, constipation and diarrhea.  Genitourinary: Negative.   Musculoskeletal:  Negative for joint pain and myalgias.  Skin: Negative.   Neurological: Negative.  Negative for dizziness and headaches.  Endo/Heme/Allergies: Negative.   All other systems reviewed and are negative.      Objective:   BP 127/81   Pulse 91   Ht 5' 2 (1.575 m)   Wt 247 lb 3.2 oz (112.1 kg)   LMP 03/29/2020 (Approximate)   SpO2  98%   BMI 45.21 kg/m   Vitals:   10/13/23 1020  BP: 127/81  Pulse: 91  Height: 5' 2 (1.575 m)  Weight: 247 lb 3.2 oz (112.1 kg)  SpO2: 98%  BMI (Calculated): 45.2    Physical Exam Vitals and nursing note reviewed.  Constitutional:      Appearance: Normal appearance. She is normal weight.  HENT:     Head: Normocephalic and atraumatic.     Nose: Nose normal.     Mouth/Throat:     Mouth: Mucous membranes are moist.  Eyes:     Extraocular Movements: Extraocular movements intact.     Conjunctiva/sclera: Conjunctivae normal.     Pupils: Pupils are equal, round, and reactive to light.  Cardiovascular:     Rate and Rhythm: Normal rate and regular rhythm.     Pulses: Normal pulses.     Heart sounds: Normal heart sounds.  Pulmonary:     Effort: Pulmonary effort is normal.     Breath sounds: Normal breath sounds.  Abdominal:     General: Abdomen is flat. Bowel sounds are normal.     Palpations: Abdomen is soft.  Musculoskeletal:        General: Normal range of motion.     Cervical back: Normal range of motion.  Skin:    General: Skin is warm and dry.  Neurological:     General: No focal deficit present.     Mental Status: She is alert and oriented to person, place, and time.  Psychiatric:        Mood and Affect: Mood normal.        Behavior: Behavior normal.        Thought Content: Thought content normal.        Judgment: Judgment normal.      No results found for any visits on 10/13/23.  Recent Results (from the past 2160 hours)  Lipid Profile     Status: Abnormal   Collection Time: 08/04/23 11:26 AM  Result Value Ref Range   Cholesterol, Total 206 (H) 100 - 199 mg/dL   Triglycerides 833 (H) 0 - 149 mg/dL   HDL 39 (L) >60 mg/dL   VLDL Cholesterol Cal 30 5 - 40 mg/dL   LDL Chol Calc (NIH) 862 (H) 0 - 99 mg/dL   Chol/HDL Ratio 5.3 (H) 0.0 - 4.4 ratio    Comment:                                   T. Chol/HDL Ratio  Men   Women                               1/2 Avg.Risk  3.4    3.3                                   Avg.Risk  5.0    4.4                                2X Avg.Risk  9.6    7.1                                3X Avg.Risk 23.4   11.0   CMP14+EGFR     Status: Abnormal   Collection Time: 08/04/23 11:26 AM  Result Value Ref Range   Glucose 138 (H) 70 - 99 mg/dL   BUN 17 6 - 24 mg/dL   Creatinine, Ser 8.87 (H) 0.57 - 1.00 mg/dL   eGFR 58 (L) >40 fO/fpw/8.26   BUN/Creatinine Ratio 15 9 - 23   Sodium 139 134 - 144 mmol/L   Potassium 4.5 3.5 - 5.2 mmol/L   Chloride 100 96 - 106 mmol/L   CO2 23 20 - 29 mmol/L   Calcium  9.8 8.7 - 10.2 mg/dL   Total Protein 7.3 6.0 - 8.5 g/dL   Albumin 4.3 3.8 - 4.9 g/dL   Globulin, Total 3.0 1.5 - 4.5 g/dL   Bilirubin Total <9.7 0.0 - 1.2 mg/dL   Alkaline Phosphatase 75 44 - 121 IU/L   AST 21 0 - 40 IU/L   ALT 28 0 - 32 IU/L  TSH     Status: None   Collection Time: 08/04/23 11:26 AM  Result Value Ref Range   TSH 1.650 0.450 - 4.500 uIU/mL  Hemoglobin A1c     Status: Abnormal   Collection Time: 08/04/23 11:26 AM  Result Value Ref Range   Hgb A1c MFr Bld 7.7 (H) 4.8 - 5.6 %    Comment:          Prediabetes: 5.7 - 6.4          Diabetes: >6.4          Glycemic control for adults with diabetes: <7.0    Est. average glucose Bld gHb Est-mCnc 174 mg/dL  CBC with Differential/Platelet     Status: Abnormal   Collection Time: 08/04/23 11:26 AM  Result Value Ref Range   WBC 7.7 3.4 - 10.8 x10E3/uL   RBC 4.55 3.77 - 5.28 x10E6/uL   Hemoglobin 13.0 11.1 - 15.9 g/dL   Hematocrit 60.2 65.9 - 46.6 %   MCV 87 79 - 97 fL   MCH 28.6 26.6 - 33.0 pg   MCHC 32.7 31.5 - 35.7 g/dL   RDW 85.2 88.2 - 84.5 %   Platelets 302 150 - 450 x10E3/uL   Neutrophils 48 Not Estab. %   Lymphs 44 Not Estab. %   Monocytes 5 Not Estab. %   Eos 2 Not Estab. %   Basos 1 Not Estab. %   Neutrophils Absolute 3.7 1.4 - 7.0 x10E3/uL   Lymphocytes Absolute 3.4 (H) 0.7 - 3.1 x10E3/uL    Monocytes Absolute 0.4 0.1 - 0.9 x10E3/uL   EOS (ABSOLUTE)  0.2 0.0 - 0.4 x10E3/uL   Basophils Absolute 0.0 0.0 - 0.2 x10E3/uL   Immature Granulocytes 0 Not Estab. %   Immature Grans (Abs) 0.0 0.0 - 0.1 x10E3/uL  CBC     Status: Abnormal   Collection Time: 10/02/23  8:20 AM  Result Value Ref Range   WBC 8.6 4.0 - 10.5 K/uL   RBC 4.08 3.87 - 5.11 MIL/uL   Hemoglobin 11.7 (L) 12.0 - 15.0 g/dL   HCT 63.6 63.9 - 53.9 %   MCV 89.0 80.0 - 100.0 fL   MCH 28.7 26.0 - 34.0 pg   MCHC 32.2 30.0 - 36.0 g/dL   RDW 84.4 88.4 - 84.4 %   Platelets 293 150 - 400 K/uL   nRBC 0.0 0.0 - 0.2 %    Comment: Performed at [facility], [address] [place], [place] [address]  Comprehensive metabolic panel     Status: Abnormal   Collection Time: 10/02/23  8:20 AM  Result Value Ref Range   Sodium 140 135 - 145 mmol/L   Potassium 3.7 3.5 - 5.1 mmol/L   Chloride 103 98 - 111 mmol/L   CO2 27 22 - 32 mmol/L   Glucose, Bld 137 (H) 70 - 99 mg/dL    Comment: Glucose reference range applies only to samples taken after fasting for at least 8 hours.   BUN 19 6 - 20 mg/dL   Creatinine, Ser 8.80 (H) 0.44 - 1.00 mg/dL   Calcium  9.1 8.9 - 10.3 mg/dL   Total Protein 7.0 6.5 - 8.1 g/dL   Albumin 3.7 3.5 - 5.0 g/dL   AST 24 15 - 41 U/L   ALT 29 0 - 44 U/L   Alkaline Phosphatase 74 38 - 126 U/L   Total Bilirubin 0.3 0.0 - 1.2 mg/dL   GFR, Estimated 54 (L) >60 mL/min    Comment: (NOTE) Calculated using the CKD-EPI Creatinine Equation (2021)    Anion gap 10 5 - 15    Comment: Performed at [facility], [address] [place], [place] [address]  Lipase, blood     Status: Abnormal   Collection Time: 10/02/23  8:20 AM  Result Value Ref Range   Lipase 53 (H) 11 - 51 U/L    Comment: Performed at [facility], [address], [place], [place] [address]  Troponin I (High Sensitivity)     Status: None   Collection Time: 10/02/23  8:20 AM  Result Value Ref Range   Troponin I (High  Sensitivity) 4 <18 ng/L    Comment: (NOTE) Elevated high sensitivity troponin I (hsTnI) values and significant  changes across serial measurements may suggest ACS but many other  chronic and acute conditions are known to elevate hsTnI results.  Refer to the Links section for chest pain algorithms and additional  guidance. Performed at [facility], [address] [place], [place] [address]   Magnesium      Status: None   Collection Time: 10/02/23  8:20 AM  Result Value Ref Range   Magnesium  1.7 1.7 - 2.4 mg/dL    Comment: Performed at [facility], [address] [place], [place] [address]  Troponin I (High Sensitivity)     Status: None   Collection Time: 10/02/23 10:15 AM  Result Value Ref Range   Troponin I (High Sensitivity) 7 <18 ng/L    Comment: (NOTE) Elevated high sensitivity troponin I (hsTnI) values and significant  changes across serial measurements may suggest ACS but  many other  chronic and acute conditions are known to elevate hsTnI results.  Refer to the Links section for chest pain algorithms and additional  guidance. Performed at [facility], [address] [place], [place] [address]       Assessment & Plan:  Ozempic  sent to pharmacy  Problem List Items Addressed This Visit       Endocrine   Diabetes mellitus without complication (HCC) - Primary   Relevant Medications   Semaglutide ,0.25 or 0.5MG /DOS, (OZEMPIC , 0.25 OR 0.5 MG/DOSE,) 2 MG/1.5ML SOPN     Other   Weight loss counseling, encounter for   Class 3 severe obesity due to excess calories with serious comorbidity and body mass index (BMI) of 40.0 to 44.9 in adult   Relevant Medications   Semaglutide ,0.25 or 0.5MG /DOS, (OZEMPIC , 0.25 OR 0.5 MG/DOSE,) 2 MG/1.5ML SOPN    Return in about 6 weeks (around 11/24/2023).   Total time spent: 25 minutes  [employer], NP  10/13/2023   This document may have been prepared by Dragon Voice Recognition software and  as such may include unintentional dictation errors.

## 2023-11-24 ENCOUNTER — Inpatient Hospital Stay
Admission: EM | Admit: 2023-11-24 | Discharge: 2023-11-27 | DRG: 638 | Disposition: A | Discharge status: Home / Self Care | Attending: Student | Admitting: Student

## 2023-11-24 ENCOUNTER — Emergency Department

## 2023-11-24 ENCOUNTER — Encounter: Payer: Self-pay | Admitting: Family Medicine

## 2023-11-24 ENCOUNTER — Other Ambulatory Visit: Payer: Self-pay

## 2023-11-24 DIAGNOSIS — F17210 Nicotine dependence, cigarettes, uncomplicated: Secondary | ICD-10-CM | POA: Diagnosis present

## 2023-11-24 DIAGNOSIS — Z7985 Long-term (current) use of injectable non-insulin antidiabetic drugs: Secondary | ICD-10-CM

## 2023-11-24 DIAGNOSIS — Z91018 Allergy to other foods: Secondary | ICD-10-CM

## 2023-11-24 DIAGNOSIS — E279 Disorder of adrenal gland, unspecified: Secondary | ICD-10-CM | POA: Diagnosis present

## 2023-11-24 DIAGNOSIS — Z1152 Encounter for screening for COVID-19: Secondary | ICD-10-CM

## 2023-11-24 DIAGNOSIS — Z833 Family history of diabetes mellitus: Secondary | ICD-10-CM

## 2023-11-24 DIAGNOSIS — D259 Leiomyoma of uterus, unspecified: Secondary | ICD-10-CM | POA: Diagnosis present

## 2023-11-24 DIAGNOSIS — Z8249 Family history of ischemic heart disease and other diseases of the circulatory system: Secondary | ICD-10-CM

## 2023-11-24 DIAGNOSIS — E278 Other specified disorders of adrenal gland: Secondary | ICD-10-CM | POA: Diagnosis present

## 2023-11-24 DIAGNOSIS — N179 Acute kidney failure, unspecified: Secondary | ICD-10-CM | POA: Diagnosis not present

## 2023-11-24 DIAGNOSIS — Z6839 Body mass index (BMI) 39.0-39.9, adult: Secondary | ICD-10-CM

## 2023-11-24 DIAGNOSIS — R1900 Intra-abdominal and pelvic swelling, mass and lump, unspecified site: Secondary | ICD-10-CM | POA: Insufficient documentation

## 2023-11-24 DIAGNOSIS — E782 Mixed hyperlipidemia: Secondary | ICD-10-CM | POA: Diagnosis present

## 2023-11-24 DIAGNOSIS — E1110 Type 2 diabetes mellitus with ketoacidosis without coma: Secondary | ICD-10-CM | POA: Diagnosis not present

## 2023-11-24 DIAGNOSIS — Z888 Allergy status to other drugs, medicaments and biological substances status: Secondary | ICD-10-CM

## 2023-11-24 DIAGNOSIS — I10 Essential (primary) hypertension: Secondary | ICD-10-CM | POA: Diagnosis not present

## 2023-11-24 DIAGNOSIS — Z7984 Long term (current) use of oral hypoglycemic drugs: Secondary | ICD-10-CM

## 2023-11-24 DIAGNOSIS — E66812 Obesity, class 2: Secondary | ICD-10-CM | POA: Diagnosis present

## 2023-11-24 DIAGNOSIS — E860 Dehydration: Secondary | ICD-10-CM | POA: Diagnosis present

## 2023-11-24 DIAGNOSIS — R631 Polydipsia: Secondary | ICD-10-CM | POA: Diagnosis present

## 2023-11-24 DIAGNOSIS — K769 Liver disease, unspecified: Secondary | ICD-10-CM | POA: Insufficient documentation

## 2023-11-24 DIAGNOSIS — E66813 Obesity, class 3: Secondary | ICD-10-CM

## 2023-11-24 DIAGNOSIS — Z79899 Other long term (current) drug therapy: Secondary | ICD-10-CM

## 2023-11-24 DIAGNOSIS — Z6841 Body Mass Index (BMI) 40.0 and over, adult: Secondary | ICD-10-CM

## 2023-11-24 LAB — BASIC METABOLIC PANEL WITH GFR
Anion gap: 22 — ABNORMAL HIGH (ref 5–15)
Anion gap: 20 — ABNORMAL HIGH (ref 5–15)
BUN: 33 mg/dL — ABNORMAL HIGH (ref 6–20)
BUN: 30 mg/dL — ABNORMAL HIGH (ref 6–20)
CO2: 18 mmol/L — ABNORMAL LOW (ref 22–32)
CO2: 16 mmol/L — ABNORMAL LOW (ref 22–32)
Calcium: 10.6 mg/dL — ABNORMAL HIGH (ref 8.9–10.3)
Calcium: 9.9 mg/dL (ref 8.9–10.3)
Chloride: 93 mmol/L — ABNORMAL LOW (ref 98–111)
Chloride: 97 mmol/L — ABNORMAL LOW (ref 98–111)
Creatinine, Ser: 1.64 mg/dL — ABNORMAL HIGH (ref 0.44–1.00)
Creatinine, Ser: 1.42 mg/dL — ABNORMAL HIGH (ref 0.44–1.00)
GFR, Estimated: 37 mL/min — ABNORMAL LOW (ref >60)
GFR, Estimated: 44 mL/min — ABNORMAL LOW (ref >60)
Glucose, Bld: 516 mg/dL (ref 70–99)
Glucose, Bld: 462 mg/dL — ABNORMAL HIGH (ref 70–99)
Potassium: 4.8 mmol/L (ref 3.5–5.1)
Potassium: 4.8 mmol/L (ref 3.5–5.1)
Sodium: 133 mmol/L — ABNORMAL LOW (ref 135–145)
Sodium: 133 mmol/L — ABNORMAL LOW (ref 135–145)

## 2023-11-24 LAB — CBG MONITORING, ED
Glucose-Capillary: 486 mg/dL — ABNORMAL HIGH (ref 70–99)
Glucose-Capillary: 414 mg/dL — ABNORMAL HIGH (ref 70–99)
Glucose-Capillary: 441 mg/dL — ABNORMAL HIGH (ref 70–99)
Glucose-Capillary: 466 mg/dL — ABNORMAL HIGH (ref 70–99)
Glucose-Capillary: 414 mg/dL — ABNORMAL HIGH (ref 70–99)

## 2023-11-24 LAB — CBC WITH DIFFERENTIAL/PLATELET
Abs Immature Granulocytes: 0.03 K/uL (ref 0.00–0.07)
Basophils Absolute: 0 K/uL (ref 0.0–0.1)
Basophils Relative: 0 %
Eosinophils Absolute: 0 K/uL (ref 0.0–0.5)
Eosinophils Relative: 0 %
HCT: 40.1 % (ref 36.0–46.0)
Hemoglobin: 13.2 g/dL (ref 12.0–15.0)
Immature Granulocytes: 0 %
Lymphocytes Relative: 31 %
Lymphs Abs: 2.7 K/uL (ref 0.7–4.0)
MCH: 28.8 pg (ref 26.0–34.0)
MCHC: 32.9 g/dL (ref 30.0–36.0)
MCV: 87.6 fL (ref 80.0–100.0)
Monocytes Absolute: 0.5 K/uL (ref 0.1–1.0)
Monocytes Relative: 5 %
Neutro Abs: 5.6 K/uL (ref 1.7–7.7)
Neutrophils Relative %: 64 %
Platelets: 348 K/uL (ref 150–400)
RBC: 4.58 MIL/uL (ref 3.87–5.11)
RDW: 14.9 % (ref 11.5–15.5)
WBC: 8.8 K/uL (ref 4.0–10.5)
nRBC: 0 % (ref 0.0–0.2)

## 2023-11-24 LAB — RESP PANEL BY RT-PCR (RSV, FLU A&B, COVID)  RVPGX2
Influenza A by PCR: NEGATIVE
Influenza B by PCR: NEGATIVE
Resp Syncytial Virus by PCR: NEGATIVE
SARS Coronavirus 2 by RT PCR: NEGATIVE

## 2023-11-24 LAB — BLOOD GAS, VENOUS

## 2023-11-24 LAB — BRAIN NATRIURETIC PEPTIDE: B Natriuretic Peptide: 14 pg/mL (ref 0.0–100.0)

## 2023-11-24 LAB — BETA-HYDROXYBUTYRIC ACID: Beta-Hydroxybutyric Acid: 6.51 mmol/L — ABNORMAL HIGH (ref 0.05–0.27)

## 2023-11-24 MED ORDER — SODIUM CHLORIDE 0.9 % IV BOLUS
1000.0000 mL | Freq: Once | INTRAVENOUS | Status: AC
  Administered 2023-11-24: 1000 mL via INTRAVENOUS

## 2023-11-24 MED ORDER — IPRATROPIUM-ALBUTEROL 0.5-2.5 (3) MG/3ML IN SOLN
3.0000 mL | Freq: Once | RESPIRATORY_TRACT | Status: AC
  Administered 2023-11-24: 3 mL via RESPIRATORY_TRACT
  Filled 2023-11-24: qty 3

## 2023-11-24 MED ORDER — DEXTROSE IN LACTATED RINGERS 5 % IV SOLN: INTRAVENOUS | Status: DC

## 2023-11-24 MED ORDER — LACTATED RINGERS IV SOLN: INTRAVENOUS | Status: DC

## 2023-11-24 MED ORDER — POTASSIUM CHLORIDE 10 MEQ/100ML IV SOLN
10.0000 meq | INTRAVENOUS | Status: AC
  Administered 2023-11-24 – 2023-11-25 (×2): 10 meq via INTRAVENOUS
  Filled 2023-11-24 (×3): qty 100

## 2023-11-24 MED ORDER — IOHEXOL 300 MG/ML  SOLN
75.0000 mL | Freq: Once | INTRAMUSCULAR | Status: AC | PRN
  Administered 2023-11-24: 75 mL via INTRAVENOUS

## 2023-11-24 MED ORDER — INSULIN REGULAR(HUMAN) IN NACL 100-0.9 UT/100ML-% IV SOLN
INTRAVENOUS | Status: DC
  Administered 2023-11-24: 13 [IU]/h via INTRAVENOUS
  Filled 2023-11-24 (×2): qty 100

## 2023-11-24 MED ORDER — DEXTROSE 50 % IV SOLN: 0.0000 mL | INTRAVENOUS | Status: DC | PRN

## 2023-11-24 MED ORDER — INSULIN ASPART 100 UNIT/ML IJ SOLN
5.0000 [IU] | Freq: Once | INTRAMUSCULAR | Status: AC
  Administered 2023-11-24: 5 [IU] via SUBCUTANEOUS
  Filled 2023-11-24: qty 5

## 2023-11-24 NOTE — Assessment & Plan Note (Signed)
 BMI >40, down with Mounjaro , complicates care

## 2023-11-24 NOTE — ED Triage Notes (Addendum)
 Patient Called EMS C/O SOB, but is also C/O poor PO intake, nausea, and constipation for the last week. Per EMS, patient's blood sugar was 562. Patient does endorse history of COPD and states that she used to be prescribed inhalers but she hasn't had one in awhile. Patient is on room air and in no apparent respiratory distress.

## 2023-11-24 NOTE — Assessment & Plan Note (Signed)
 Adrenal nodule Incidentally noted multiple hyperenhancing liver lesions. - Radiology recommend: When the patient is clinically stable and able to follow directions and hold their breath (preferably as an outpatient) further evaluation with dedicated abdominal MRI should be considered. - Also recommended is nonemergent adrenal washout CT or chemical shift MRI for further assessment of adrenal nodule

## 2023-11-24 NOTE — ED Notes (Signed)
 Pt c/o of burning in RAC, potassium infusion rate decreased to 50ml/hr

## 2023-11-24 NOTE — H&P (Signed)
 History and Physical    Patient: Vanessa Wyatt FMW:[id number] DOB: Aug 06, 1965 DOA: 11/24/2023 DOS: the patient was seen and examined on 11/24/2023 PCP: Carin Gauze, NP  Patient coming from: Home  Chief Complaint:  Chief Complaint  Patient presents with   Shortness of Breath       HPI:  58 y.o. F with DM, HTN, obesity, headaches/tinnitus who presented with SOB, nausea, malaise for several days ago.  Few days ago started to have malaise, polyuria, felt weak and tired like she was dehydrated.  Was drinking a lot of water, apple juice, and cranberry juice, and seemed to be getting better until this morning, woke up with generalized weakness, feeling a lot worse, nausea, abdominal discomfort, so came to the ER.  In the ER, glucose 562, bicarb 18, AG 22.  Cr 1.6 from baseline 1.1.  Abnormal CT chest noted.  Started on fluids, insulin  drip  and hospitalists called for DKA.      Review of Systems  Constitutional:  Positive for malaise/fatigue. Negative for fever.  Eyes:  Negative for blurred vision and double vision.  Respiratory:  Negative for cough, sputum production and shortness of breath.   Cardiovascular:  Negative for chest pain.  Gastrointestinal:  Positive for constipation and nausea. Negative for abdominal pain, blood in stool, diarrhea, melena and vomiting.  Genitourinary:  Negative for dysuria and urgency.  Neurological:  Positive for weakness. Negative for dizziness, speech change, focal weakness and headaches.  Endo/Heme/Allergies:  Positive for polydipsia.  All other systems reviewed and are negative.    Past Medical History:  Diagnosis Date   Arthritis    bilateral legs   Diabetes (HCC)    Hyperlipidemia    Hypertension    History reviewed. No pertinent surgical history. Social History:  reports that she has been smoking cigarettes. She has never used smokeless tobacco. She reports that she does not drink alcohol and does not use drugs.  Allergies   Allergen Reactions   Benadryl [Diphenhydramine] Anaphylaxis   Citrullus Vulgaris Anaphylaxis   Coconut (Cocos Nucifera) Anaphylaxis   Flavoring Agent (Non-Screening) Anaphylaxis   Tomato Anaphylaxis   Other Swelling    Berries, watermelon, coconut    Family History  Problem Relation Age of Onset   Hypertension Mother    Hypertension Father    Diabetes Father     Prior to Admission medications   Medication Sig Start Date End Date Taking? Authorizing Provider  atorvastatin  (LIPITOR) 20 MG tablet Take 1 tablet (20 mg total) by mouth daily. 08/04/23  Yes Scoggins, Hospital doctor, NP  cetirizine  (ZYRTEC ) 10 MG tablet Take 1 tablet (10 mg total) by mouth daily. 08/04/23  Yes Scoggins, Amber, NP  furosemide  (LASIX ) 20 MG tablet Take 1 tablet (20 mg total) by mouth daily as needed. 08/04/23 11/24/23 Yes Scoggins, Amber, NP  losartan -hydrochlorothiazide (HYZAAR) 50-12.5 MG tablet Take 1 tablet by mouth daily. 08/04/23  Yes Scoggins, Hospital doctor, NP  metFORMIN  (GLUCOPHAGE ) 500 MG tablet Take 1 tablet (500 mg total) by mouth 2 (two) times daily with a meal. 08/04/23  Yes Scoggins, Amber, NP  acetaminophen  (TYLENOL ) 650 MG CR tablet Take 650 mg by mouth every 8 (eight) hours as needed for pain.    [provider]  fluticasone  (FLONASE ) 50 MCG/ACT nasal spray Place 2 sprays into both nostrils daily. 08/04/23 08/03/24  Scoggins, Amber, NP  omeprazole  (PRILOSEC  OTC) 20 MG tablet Take 1 tablet (20 mg total) by mouth daily. 10/02/23 11/01/23  Suzanne Kirsch, MD  ondansetron  (ZOFRAN -ODT) 4  MG disintegrating tablet Take 1 tablet (4 mg total) by mouth every 8 (eight) hours as needed for nausea or vomiting. 10/02/23   Suzanne Kirsch, MD  Semaglutide ,0.25 or 0.5MG /DOS, (OZEMPIC , 0.25 OR 0.5 MG/DOSE,) 2 MG/1.5ML SOPN Inject 0.25 mg into the skin once a week. 10/13/23   Scoggins, Amber, NP  SUMAtriptan  (IMITREX ) 25 MG tablet Take 1 tablet (25 mg total) by mouth once as needed for migraine. May repeat in 2 hours if headache persists or  recurs. 09/01/23 09/01/24  Carin Gauze, NP    Physical Exam: Vitals:   11/24/23 1947 11/24/23 1948 11/24/23 1958 11/24/23 1958  BP:    (!) 143/90  Pulse:  84    Resp:  18    Temp:  98.4 F (36.9 C)    TempSrc:  Oral    SpO2:  100% 100%   Weight: 98.4 kg     Height: 5' 2 (1.575 m)      Obese adult female, lying in bed, interactive and appropriate, answers questions normally Anicteric, conjunctiva pink, lids and lashes normal.  No nasal deformity, discharge, or epistaxis Oropharynx tacky dry, no oral lesions, dentition in good repair, lips normal RRR, no murmurs, no peripheral edema Respiratory rate normal, lungs clear without rales or wheezes Abdomen soft, no tenderness palpation, no distention or ascites, no masses appreciated although exam limited by habitus Attention normal, affect appropriate, oriented x 3, upper extremities lower extremity strength 5/5 and symmetric, speech fluent     Data Reviewed: Patient metabolic panel shows creatinine 1.6, glucose 516, bicarb 18, and anion gap 22 Mild hypokalemia left, to be treated with fluids Brain natruretic peptide normal CBC shows no leukocytosis or anemia COVID test negative Chest x-ray, personally reviewed, shows no airspace disease or opacity.  There is a small lucency seen in the rib, some follow-up CT chest was obtained which showed that this was an old rib fracture.  The CT chest also however did show some 5 cm abdominal masses, possibly adnexal, also some hyperenhancing liver lesions, and an adrenal nodule, all of which need follow-up imaging, some of it in the hospital.    Assessment and Plan: * DKA (diabetic ketoacidosis) (HCC) Presented with hyperglycemia, acidosis, mild.   - IV insulin  infusion - IV fluids - Titrate insulin  per protocol - Q4 BMP until gap closed    Abdominal mass This was an incidental finding. - If Cr normal in AM, will need CT abdomen and pelvis with contrast otherwise will need  TVUS   AKI (acute kidney injury) (HCC) Cr baseline is 1.0-1.2, here up to 1.6 due to DKA - IV fluids - Trend Cr - Hold diuretics, losartan    Liver lesions Adrenal nodule Incidentally noted multiple hyperenhancing liver lesions. - Radiology recommend: When the patient is clinically stable and able to follow directions and hold their breath (preferably as an outpatient) further evaluation with dedicated abdominal MRI should be considered. - Also recommended is nonemergent adrenal washout CT or chemical shift MRI for further assessment of adrenal nodule    Class 3 severe obesity due to excess calories with serious comorbidity and body mass index (BMI) of 40.0 to 44.9 in adult BMI >40, down with Mounjaro , complicates care  Mixed hyperlipidemia - Continue Lipitor  Primary hypertension - Hold furosemide , losartan -hydrochlorothiazide given AKI - IV fluids         Advance Care Planning: Full code  Consults: None needed  Family Communication: None present  Severity of Illness: The appropriate patient status for this patient  is OBSERVATION. Observation status is judged to be reasonable and necessary in order to provide the required intensity of service to ensure the patient's safety. The patient's presenting symptoms, physical exam findings, and initial radiographic and laboratory data in the context of their medical condition is felt to place them at decreased risk for further clinical deterioration. Furthermore, it is anticipated that the patient will be medically stable for discharge from the hospital within 2 midnights of admission.   Author: Lonni SHAUNNA Dalton, MD 11/24/2023 10:04 PM  For on call review [email or web address].

## 2023-11-24 NOTE — Hospital Course (Addendum)
 58 y.o. F with DM, HTN, obesity, headaches/tinnitus who presented with SOB, nausea, malaise for several days ago.  Few days ago started to have malaise, polyuria, felt weak and tired like she was dehydrated.  Was drinking a lot of water, apple juice, and cranberry juice, and seemed to be getting better until this morning, woke up with generalized weakness, feeling a lot worse, nausea, abdominal discomfort, so came to the ER.  In the ER, glucose 562, bicarb 18, AG 22.  Cr 1.6 from baseline 1.1.  Abnormal CT chest noted.  Started on fluids, insulin  drip  and hospitalists called for DKA.

## 2023-11-24 NOTE — Assessment & Plan Note (Signed)
 Cr baseline is 1.0-1.2, here up to 1.6 due to DKA - IV fluids - Trend Cr - Hold diuretics, losartan 

## 2023-11-24 NOTE — Assessment & Plan Note (Addendum)
 This was an incidental finding. - If Cr normal in AM, will need CT abdomen and pelvis with contrast otherwise will need TVUS

## 2023-11-24 NOTE — Assessment & Plan Note (Signed)
 Presented with hyperglycemia, acidosis, mild.   - IV insulin  infusion - IV fluids - Titrate insulin  per protocol - Q4 BMP until gap closed

## 2023-11-24 NOTE — Assessment & Plan Note (Signed)
 -  Continue Lipitor

## 2023-11-24 NOTE — Assessment & Plan Note (Signed)
-   Hold furosemide , losartan -hydrochlorothiazide given AKI - IV fluids

## 2023-11-24 NOTE — ED Provider Notes (Signed)
 [facility] Provider Note    Event Date/Time   First MD Initiated Contact with Patient 11/24/23 1938     (approximate)   History   Shortness of Breath   HPI  Vanessa Wyatt is a 58 y.o. female  hypertension, hyperlipidemia, diabetes, current smoker x 30+ years presenting generalized weakness, shortness of breath.  Patient states that she has not been eating well for the past week and has not given herself any insulin .  She continues to smoke.  Denies any chest pain abdominal pain changes in bowel habits.  She does endorse increased urination      Physical Exam   Triage Vital Signs: ED Triage Vitals  Encounter Vitals Group     BP      Girls Systolic BP Percentile      Girls Diastolic BP Percentile      Boys Systolic BP Percentile      Boys Diastolic BP Percentile      Pulse      Resp      Temp      Temp src      SpO2      Weight      Height      Head Circumference      Peak Flow      Pain Score      Pain Loc      Pain Education      Exclude from Growth Chart     Most recent vital signs: Vitals:   11/24/23 2222 11/24/23 2300  BP:  (!) 149/109  Pulse: 93 98  Resp: 15 16  Temp:    SpO2: 100% 98%    Nursing Triage Note reviewed. Vital signs reviewed and patients oxygen saturation is normoxic  General: Patient is well nourished, well developed, awake and alert, resting comfortably in no acute distress Head: Normocephalic and atraumatic Eyes: Normal inspection, extraocular muscles intact, no conjunctival pallor Ear, nose, throat: Normal external exam Neck: Normal range of motion Respiratory: Patient is in no respiratory distress, lungs with mild wheezes Cardiovascular: Patient is not tachycardic, RRR without murmur appreciated GI: Abd SNT with no guarding or rebound  Back: Normal inspection of the back with good strength and range of motion throughout all ext Extremities: pulses intact with good cap refills, no LE pitting edema or  calf tenderness Neuro: The patient is alert and oriented to person, place, and time, appropriately conversive, with 5/5 bilat UE/LE strength, no gross motor or sensory defects noted. Coordination appears to be adequate. Skin: Warm, dry, and intact Psych: normal mood and affect, no SI or HI  ED Results / Procedures / Treatments   Labs (all labs ordered are listed, but only abnormal results are displayed) Labs Reviewed  BASIC METABOLIC PANEL WITH GFR - Abnormal; Notable for the following components:      Result Value   Sodium 133 (*)    Chloride 93 (*)    CO2 18 (*)    Glucose, Bld 516 (*)    BUN 33 (*)    Creatinine, Ser 1.64 (*)    Calcium  10.6 (*)    GFR, Estimated 37 (*)    Anion gap 22 (*)    All other components within normal limits  BLOOD GAS, VENOUS - Abnormal; Notable for the following components:   pCO2, Ven 42 (*)    pO2, Ven <31 (*)    Acid-base deficit 6.1 (*)    All other components within normal limits  BASIC METABOLIC PANEL WITH GFR - Abnormal; Notable for the following components:   Sodium 133 (*)    Chloride 97 (*)    CO2 16 (*)    Glucose, Bld 462 (*)    BUN 30 (*)    Creatinine, Ser 1.42 (*)    GFR, Estimated 44 (*)    Anion gap 20 (*)    All other components within normal limits  BETA-HYDROXYBUTYRIC ACID - Abnormal; Notable for the following components:   Beta-Hydroxybutyric Acid 6.51 (*)    All other components within normal limits  CBG MONITORING, ED - Abnormal; Notable for the following components:   Glucose-Capillary 486 (*)    All other components within normal limits  CBG MONITORING, ED - Abnormal; Notable for the following components:   Glucose-Capillary 414 (*)    All other components within normal limits  CBG MONITORING, ED - Abnormal; Notable for the following components:   Glucose-Capillary 441 (*)    All other components within normal limits  CBG MONITORING, ED - Abnormal; Notable for the following components:   Glucose-Capillary 466  (*)    All other components within normal limits  CBG MONITORING, ED - Abnormal; Notable for the following components:   Glucose-Capillary 414 (*)    All other components within normal limits  RESP PANEL BY RT-PCR (RSV, FLU A&B, COVID)  RVPGX2  MRSA NEXT GEN BY PCR, NASAL  CBC WITH DIFFERENTIAL/PLATELET  BRAIN NATRIURETIC PEPTIDE  URINALYSIS, ROUTINE W REFLEX MICROSCOPIC  BASIC METABOLIC PANEL WITH GFR  BASIC METABOLIC PANEL WITH GFR  BASIC METABOLIC PANEL WITH GFR  BETA-HYDROXYBUTYRIC ACID  BETA-HYDROXYBUTYRIC ACID  BETA-HYDROXYBUTYRIC ACID     EKG   RADIOLOGY Xray chest: No acute abnormality on my independent review interpretation radiologist agrees but also states that he recommends a CT chest given abnormality seen in thoracic spine CT chest: Pending   PROCEDURES:  Critical Care performed: Yes, see critical care procedure note(s)  .Critical Care  Performed by: Nicholaus Rolland BRAVO, MD Authorized by: Nicholaus Rolland BRAVO, MD   Critical care provider statement:    Critical care time (minutes):  30   Critical care was necessary to treat or prevent imminent or life-threatening deterioration of the following conditions:  Endocrine crisis   Critical care was time spent personally by me on the following activities:  Development of treatment plan with patient or surrogate, discussions with consultants, evaluation of patient's response to treatment, examination of patient, ordering and review of laboratory studies, ordering and review of radiographic studies, ordering and performing treatments and interventions, pulse oximetry, re-evaluation of patient's condition and review of old charts   Care discussed with: admitting provider   Comments:     Diabetic ketoacidosis and need for insulin  drip    MEDICATIONS ORDERED IN ED: Medications  insulin  regular, human (MYXREDLIN ) 100 units/ 100 mL infusion (17 Units/hr Intravenous Rate/Dose Change 11/24/23 2220)  lactated ringers  infusion (  Intravenous New Bag/Given 11/24/23 2206)  dextrose  5 % in lactated ringers  infusion (0 mLs Intravenous Hold 11/24/23 2211)  dextrose  50 % solution 0-50 mL (has no administration in time range)  potassium chloride  10 mEq in 100 mL IVPB (10 mEq Intravenous New Bag/Given 11/24/23 2210)  ipratropium-albuterol  (DUONEB) 0.5-2.5 (3) MG/3ML nebulizer solution 3 mL (3 mLs Nebulization Given 11/24/23 2008)  sodium chloride  0.9 % bolus 1,000 mL (0 mLs Intravenous Stopped 11/24/23 2118)  iohexol  (OMNIPAQUE ) 300 MG/ML solution 75 mL (75 mLs Intravenous Contrast Given 11/24/23 2122)  insulin  aspart (novoLOG ) injection 5  Units (5 Units Subcutaneous Given 11/24/23 2149-12-17)     IMPRESSION / MDM / ASSESSMENT AND PLAN / ED COURSE                                Differential diagnosis includes, but is not limited to, DKA, COPD/bronchospasm, HHS, electrolyte derangement, UTI, anemia   ED course: Patient presents another initial complaint with shortness of breath, she is satting well on room air and pulmonary exam only demonstrates mild wheezing.  Patient's glucose was profoundly elevated here and she was initiated on IV fluids.  Patient did have an anion gap and VBG was consistent with acidosis.  She was given a bolus of insulin  and initiated on the insulin  drip protocol.  Case discussed with hospitalist for admission   Clinical Course as of 11/24/23 12-18-2330  Thu Nov 24, 2023  2112 Anion gap(!): 22 Patient does have a mild anion gap [HD]  2111/12/18 Blood gas, venous(!!) This is consistent with DKA [HD]    Clinical Course User Index [HD] Nicholaus Rolland BRAVO, MD   Data(2/3 categories following were performed): I reviewed or ordered at least three unique tests, external notes, and/or the history required an independent historian as one of the three requirements as following: At least 3 labs/imaging studies were obtained and/or reviewed. AND  I discussed the management of the patient with the following external physician or  qualified healthcare provider: Admitting physician  Risk: This patient has a high risk of morbidity due to further diagnostic testing or treatment. Rationale: Decision made regarding admission  Admit Level 5 - Labs/Rads, Admit, Consult:  Suggested E/M Coding Level: 5, 99285  This level has been selected based on the 12-17-21 CPT guidelines for E/M codes in the Emergency Department based on 2/3 of the CoPA, Data, and Risk.   FINAL CLINICAL IMPRESSION(S) / ED DIAGNOSES   Final diagnoses:  Diabetic ketoacidosis without coma associated with type 2 diabetes mellitus (HCC)     Rx / DC Orders   ED Discharge Orders     None        Note:  This document was prepared using Dragon voice recognition software and may include unintentional dictation errors.   Nicholaus Rolland BRAVO, MD 11/24/23 [phone]

## 2023-11-25 DIAGNOSIS — Z888 Allergy status to other drugs, medicaments and biological substances status: Secondary | ICD-10-CM | POA: Diagnosis not present

## 2023-11-25 DIAGNOSIS — R631 Polydipsia: Secondary | ICD-10-CM | POA: Diagnosis present

## 2023-11-25 DIAGNOSIS — I10 Essential (primary) hypertension: Secondary | ICD-10-CM | POA: Diagnosis present

## 2023-11-25 DIAGNOSIS — N179 Acute kidney failure, unspecified: Secondary | ICD-10-CM | POA: Diagnosis present

## 2023-11-25 DIAGNOSIS — Z833 Family history of diabetes mellitus: Secondary | ICD-10-CM | POA: Diagnosis not present

## 2023-11-25 DIAGNOSIS — Z7984 Long term (current) use of oral hypoglycemic drugs: Secondary | ICD-10-CM | POA: Diagnosis not present

## 2023-11-25 DIAGNOSIS — Z79899 Other long term (current) drug therapy: Secondary | ICD-10-CM | POA: Diagnosis not present

## 2023-11-25 DIAGNOSIS — E279 Disorder of adrenal gland, unspecified: Secondary | ICD-10-CM | POA: Diagnosis present

## 2023-11-25 DIAGNOSIS — E782 Mixed hyperlipidemia: Secondary | ICD-10-CM | POA: Diagnosis present

## 2023-11-25 DIAGNOSIS — Z6839 Body mass index (BMI) 39.0-39.9, adult: Secondary | ICD-10-CM | POA: Diagnosis not present

## 2023-11-25 DIAGNOSIS — Z1152 Encounter for screening for COVID-19: Secondary | ICD-10-CM | POA: Diagnosis not present

## 2023-11-25 DIAGNOSIS — Z6841 Body Mass Index (BMI) 40.0 and over, adult: Secondary | ICD-10-CM | POA: Diagnosis not present

## 2023-11-25 DIAGNOSIS — Z7985 Long-term (current) use of injectable non-insulin antidiabetic drugs: Secondary | ICD-10-CM | POA: Diagnosis not present

## 2023-11-25 DIAGNOSIS — E860 Dehydration: Secondary | ICD-10-CM | POA: Diagnosis present

## 2023-11-25 DIAGNOSIS — Z91018 Allergy to other foods: Secondary | ICD-10-CM | POA: Diagnosis not present

## 2023-11-25 DIAGNOSIS — F17210 Nicotine dependence, cigarettes, uncomplicated: Secondary | ICD-10-CM | POA: Diagnosis present

## 2023-11-25 DIAGNOSIS — Z8249 Family history of ischemic heart disease and other diseases of the circulatory system: Secondary | ICD-10-CM | POA: Diagnosis not present

## 2023-11-25 DIAGNOSIS — D259 Leiomyoma of uterus, unspecified: Secondary | ICD-10-CM | POA: Diagnosis present

## 2023-11-25 DIAGNOSIS — E278 Other specified disorders of adrenal gland: Secondary | ICD-10-CM | POA: Diagnosis present

## 2023-11-25 DIAGNOSIS — E1110 Type 2 diabetes mellitus with ketoacidosis without coma: Secondary | ICD-10-CM | POA: Diagnosis present

## 2023-11-25 DIAGNOSIS — E66812 Obesity, class 2: Secondary | ICD-10-CM | POA: Diagnosis present

## 2023-11-25 LAB — GLUCOSE, CAPILLARY
Glucose-Capillary: 157 mg/dL — ABNORMAL HIGH (ref 70–99)
Glucose-Capillary: 166 mg/dL — ABNORMAL HIGH (ref 70–99)
Glucose-Capillary: 184 mg/dL — ABNORMAL HIGH (ref 70–99)
Glucose-Capillary: 184 mg/dL — ABNORMAL HIGH (ref 70–99)
Glucose-Capillary: 186 mg/dL — ABNORMAL HIGH (ref 70–99)
Glucose-Capillary: 189 mg/dL — ABNORMAL HIGH (ref 70–99)
Glucose-Capillary: 193 mg/dL — ABNORMAL HIGH (ref 70–99)
Glucose-Capillary: 194 mg/dL — ABNORMAL HIGH (ref 70–99)
Glucose-Capillary: 195 mg/dL — ABNORMAL HIGH (ref 70–99)
Glucose-Capillary: 196 mg/dL — ABNORMAL HIGH (ref 70–99)
Glucose-Capillary: 212 mg/dL — ABNORMAL HIGH (ref 70–99)
Glucose-Capillary: 218 mg/dL — ABNORMAL HIGH (ref 70–99)
Glucose-Capillary: 221 mg/dL — ABNORMAL HIGH (ref 70–99)
Glucose-Capillary: 304 mg/dL — ABNORMAL HIGH (ref 70–99)
Glucose-Capillary: 335 mg/dL — ABNORMAL HIGH (ref 70–99)
Glucose-Capillary: 336 mg/dL — ABNORMAL HIGH (ref 70–99)
Glucose-Capillary: 340 mg/dL — ABNORMAL HIGH (ref 70–99)
Glucose-Capillary: 380 mg/dL — ABNORMAL HIGH (ref 70–99)

## 2023-11-25 LAB — BASIC METABOLIC PANEL WITH GFR
Anion gap: 18 — ABNORMAL HIGH (ref 5–15)
Anion gap: 16 — ABNORMAL HIGH (ref 5–15)
Anion gap: 11 (ref 5–15)
Anion gap: 9 (ref 5–15)
BUN: 29 mg/dL — ABNORMAL HIGH (ref 6–20)
BUN: 27 mg/dL — ABNORMAL HIGH (ref 6–20)
BUN: 24 mg/dL — ABNORMAL HIGH (ref 6–20)
BUN: 22 mg/dL — ABNORMAL HIGH (ref 6–20)
CO2: 21 mmol/L — ABNORMAL LOW (ref 22–32)
CO2: 21 mmol/L — ABNORMAL LOW (ref 22–32)
CO2: 23 mmol/L (ref 22–32)
CO2: 23 mmol/L (ref 22–32)
Calcium: 10.4 mg/dL — ABNORMAL HIGH (ref 8.9–10.3)
Calcium: 10.1 mg/dL (ref 8.9–10.3)
Calcium: 10 mg/dL (ref 8.9–10.3)
Calcium: 9.6 mg/dL (ref 8.9–10.3)
Chloride: 102 mmol/L (ref 98–111)
Chloride: 102 mmol/L (ref 98–111)
Chloride: 103 mmol/L (ref 98–111)
Chloride: 104 mmol/L (ref 98–111)
Creatinine, Ser: 1.32 mg/dL — ABNORMAL HIGH (ref 0.44–1.00)
Creatinine, Ser: 1.13 mg/dL — ABNORMAL HIGH (ref 0.44–1.00)
Creatinine, Ser: 1.05 mg/dL — ABNORMAL HIGH (ref 0.44–1.00)
Creatinine, Ser: 0.99 mg/dL (ref 0.44–1.00)
GFR, Estimated: 48 mL/min — ABNORMAL LOW (ref >60)
GFR, Estimated: 57 mL/min — ABNORMAL LOW (ref >60)
GFR, Estimated: >60 mL/min (ref >60)
GFR, Estimated: >60 mL/min (ref >60)
Glucose, Bld: 189 mg/dL — ABNORMAL HIGH (ref 70–99)
Glucose, Bld: 197 mg/dL — ABNORMAL HIGH (ref 70–99)
Glucose, Bld: 162 mg/dL — ABNORMAL HIGH (ref 70–99)
Glucose, Bld: 173 mg/dL — ABNORMAL HIGH (ref 70–99)
Potassium: 4.5 mmol/L (ref 3.5–5.1)
Potassium: 4.3 mmol/L (ref 3.5–5.1)
Potassium: 3.5 mmol/L (ref 3.5–5.1)
Potassium: 3.6 mmol/L (ref 3.5–5.1)
Sodium: 141 mmol/L (ref 135–145)
Sodium: 139 mmol/L (ref 135–145)
Sodium: 137 mmol/L (ref 135–145)
Sodium: 136 mmol/L (ref 135–145)

## 2023-11-25 LAB — URINALYSIS, ROUTINE W REFLEX MICROSCOPIC
Bacteria, UA: NONE SEEN
Bilirubin Urine: NEGATIVE
Glucose, UA: >=500 mg/dL — AB
Ketones, ur: 80 mg/dL — AB
Nitrite: NEGATIVE
Protein, ur: 30 mg/dL — AB
Specific Gravity, Urine: 1.032 — ABNORMAL HIGH (ref 1.005–1.030)
pH: 5 (ref 5.0–8.0)

## 2023-11-25 LAB — BETA-HYDROXYBUTYRIC ACID
Beta-Hydroxybutyric Acid: 2.59 mmol/L — ABNORMAL HIGH (ref 0.05–0.27)
Beta-Hydroxybutyric Acid: 1.9 mmol/L — ABNORMAL HIGH (ref 0.05–0.27)
Beta-Hydroxybutyric Acid: 0.34 mmol/L — ABNORMAL HIGH (ref 0.05–0.27)
Beta-Hydroxybutyric Acid: 0.36 mmol/L — ABNORMAL HIGH (ref 0.05–0.27)

## 2023-11-25 LAB — BLOOD GAS, VENOUS
Bicarbonate: 20.2 mmol/L — AB (ref 20.0–28.0)
O2 Saturation: 40.5 mmol/L — AB (ref 0.0–2.0)
Patient temperature: 37
Patient temperature: 40.5
pCO2, Ven: 42 mmHg — ABNORMAL LOW (ref 44–60)
pH, Ven: 7.29 (ref 7.25–7.43)
pO2, Ven: <31 mmol/L — AB (ref 32–45)

## 2023-11-25 LAB — MAGNESIUM: Magnesium: 2 mg/dL (ref 1.7–2.4)

## 2023-11-25 LAB — MRSA NEXT GEN BY PCR, NASAL: MRSA by PCR Next Gen: NOT DETECTED

## 2023-11-25 LAB — HIV ANTIBODY (ROUTINE TESTING W REFLEX): HIV Screen 4th Generation wRfx: NONREACTIVE

## 2023-11-25 LAB — PHOSPHORUS: Phosphorus: 2.5 mg/dL (ref 2.5–4.6)

## 2023-11-25 MED ORDER — POTASSIUM CHLORIDE CRYS ER 20 MEQ PO TBCR
40.0000 meq | EXTENDED_RELEASE_TABLET | Freq: Once | ORAL | Status: AC
  Administered 2023-11-25: 40 meq via ORAL
  Filled 2023-11-25: qty 2

## 2023-11-25 MED ORDER — INSULIN ASPART 100 UNIT/ML IJ SOLN: 0.0000 [IU] | Freq: Three times a day (TID) | INTRAMUSCULAR | Status: DC

## 2023-11-25 MED ORDER — SENNOSIDES-DOCUSATE SODIUM 8.6-50 MG PO TABS
1.0000 | ORAL_TABLET | Freq: Two times a day (BID) | ORAL | Status: DC
  Administered 2023-11-25 – 2023-11-26 (×4): 1 via ORAL
  Filled 2023-11-25 (×5): qty 1

## 2023-11-25 MED ORDER — HYDRALAZINE HCL 20 MG/ML IJ SOLN: 10.0000 mg | Freq: Four times a day (QID) | INTRAMUSCULAR | Status: DC | PRN

## 2023-11-25 MED ORDER — INSULIN ASPART 100 UNIT/ML IJ SOLN
0.0000 [IU] | Freq: Three times a day (TID) | INTRAMUSCULAR | Status: DC
  Administered 2023-11-26 (×4): 11 [IU] via SUBCUTANEOUS
  Administered 2023-11-26: 15 [IU] via SUBCUTANEOUS
  Administered 2023-11-27: 5 [IU] via SUBCUTANEOUS
  Administered 2023-11-27: 3 [IU] via SUBCUTANEOUS
  Filled 2023-11-25 (×7): qty 1

## 2023-11-25 MED ORDER — ACETAMINOPHEN 325 MG PO TABS: 650.0000 mg | ORAL_TABLET | Freq: Four times a day (QID) | ORAL | Status: DC | PRN

## 2023-11-25 MED ORDER — ONDANSETRON HCL 4 MG PO TABS: 4.0000 mg | ORAL_TABLET | Freq: Four times a day (QID) | ORAL | Status: DC | PRN

## 2023-11-25 MED ORDER — ATORVASTATIN CALCIUM 20 MG PO TABS
20.0000 mg | ORAL_TABLET | Freq: Every day | ORAL | Status: DC
  Administered 2023-11-25 – 2023-11-27 (×3): 20 mg via ORAL
  Filled 2023-11-25 (×3): qty 1

## 2023-11-25 MED ORDER — ONDANSETRON HCL 4 MG/2ML IJ SOLN: 4.0000 mg | Freq: Four times a day (QID) | INTRAMUSCULAR | Status: DC | PRN

## 2023-11-25 MED ORDER — POLYETHYLENE GLYCOL 3350 17 G PO PACK
17.0000 g | PACK | Freq: Every day | ORAL | Status: DC
  Administered 2023-11-26: 17 g via ORAL
  Filled 2023-11-25: qty 1

## 2023-11-25 MED ORDER — ACETAMINOPHEN 650 MG RE SUPP: 650.0000 mg | Freq: Four times a day (QID) | RECTAL | Status: DC | PRN

## 2023-11-25 MED ORDER — ENOXAPARIN SODIUM 60 MG/0.6ML IJ SOSY
0.5000 mg/kg | PREFILLED_SYRINGE | INTRAMUSCULAR | Status: DC
  Administered 2023-11-25 – 2023-11-27 (×3): 50 mg via SUBCUTANEOUS
  Filled 2023-11-25 (×3): qty 0.6

## 2023-11-25 MED ORDER — SODIUM CHLORIDE 0.9 % IV SOLN
INTRAVENOUS | Status: DC
Start: 2023-11-25 — End: 2023-11-26

## 2023-11-25 MED ORDER — CHLORHEXIDINE GLUCONATE CLOTH 2 % EX PADS
6.0000 | MEDICATED_PAD | Freq: Every day | CUTANEOUS | Status: DC
  Administered 2023-11-25: 6 via TOPICAL

## 2023-11-25 MED ORDER — INSULIN ASPART 100 UNIT/ML IJ SOLN
0.0000 [IU] | Freq: Three times a day (TID) | INTRAMUSCULAR | Status: DC
  Administered 2023-11-25: 11 [IU] via SUBCUTANEOUS
  Filled 2023-11-25: qty 1

## 2023-11-25 MED ORDER — CHLORHEXIDINE GLUCONATE CLOTH 2 % EX PADS: 6.0000 | MEDICATED_PAD | Freq: Every day | CUTANEOUS | Status: DC

## 2023-11-25 MED ORDER — HYDRALAZINE HCL 50 MG PO TABS: 50.0000 mg | ORAL_TABLET | Freq: Four times a day (QID) | ORAL | Status: DC | PRN

## 2023-11-25 MED ORDER — INSULIN GLARGINE-YFGN 100 UNIT/ML ~~LOC~~ SOLN
15.0000 [IU] | SUBCUTANEOUS | Status: DC
  Administered 2023-11-25 – 2023-11-26 (×2): 15 [IU] via SUBCUTANEOUS
  Filled 2023-11-25 (×2): qty 0.15

## 2023-11-25 NOTE — Plan of Care (Signed)
  Problem: Education: Goal: Knowledge of General Education information will improve Description: Including pain rating scale, medication(s)/side effects and non-pharmacologic comfort measures Outcome: Progressing   Problem: Health Behavior/Discharge Planning: Goal: Ability to manage health-related needs will improve Outcome: Progressing   Problem: Clinical Measurements: Goal: Ability to maintain clinical measurements within normal limits will improve Outcome: Progressing Goal: Will remain free from infection Outcome: Progressing Goal: Respiratory complications will improve Outcome: Progressing   Problem: Nutrition: Goal: Adequate nutrition will be maintained Outcome: Progressing   Problem: Coping: Goal: Level of anxiety will decrease Outcome: Progressing   Problem: Elimination: Goal: Will not experience complications related to urinary retention Outcome: Progressing   Problem: Pain Managment: Goal: General experience of comfort will improve and/or be controlled Outcome: Progressing   Problem: Safety: Goal: Ability to remain free from injury will improve Outcome: Progressing   Problem: Coping: Goal: Ability to adjust to condition or change in health will improve Outcome: Progressing   Problem: Skin Integrity: Goal: Risk for impaired skin integrity will decrease Outcome: Progressing   Problem: Tissue Perfusion: Goal: Adequacy of tissue perfusion will improve Outcome: Progressing   Problem: Fluid Volume: Goal: Ability to achieve a balanced intake and output will improve Outcome: Progressing   Problem: Respiratory: Goal: Will regain and/or maintain adequate ventilation Outcome: Progressing   Problem: Urinary Elimination: Goal: Ability to achieve and maintain adequate renal perfusion and functioning will improve Outcome: Progressing

## 2023-11-25 NOTE — Progress Notes (Signed)
 [facility] Progress Note  Patient: Vanessa Wyatt    FMW:[id number]  DOA: 11/24/2023     Date of Service: the patient was seen and examined on 11/25/2023  Chief Complaint  Patient presents with   Shortness of Breath   Brief hospital course: 58 y.o. F with DM, HTN, obesity, headaches/tinnitus who presented with SOB, nausea, malaise for several days ago.   Few days ago started to have malaise, polyuria, felt weak and tired like she was dehydrated.  Was drinking a lot of water, apple juice, and cranberry juice, and seemed to be getting better until this morning, woke up with generalized weakness, feeling a lot worse, nausea, abdominal discomfort, so came to the ER.   In the ER, glucose 562, bicarb 18, AG 22.  Cr 1.6 from baseline 1.1.  Abnormal CT chest noted.   Started on fluids, insulin  drip  and hospitalists called for DKA.  8/29 DKA resolved, patient was transition to subcu insulin .  Further management as below.   Assessment and Plan:  # DKA (diabetic ketoacidosis) (HCC) Presented with hyperglycemia, acidosis, mild.   S/p IV insulin  infusion.  DKA resolved, anion gap closed Transition to subcu insulin , started Semglee  15 units daily, NovoLog  sliding scale Monitor CBG, continue diabetic Continue IV fluids Follow DM coordinator     # Abdominal mass This was an incidental finding. Patient will need CTAP vs TVUS Check creatinine tomorrow and decide accordingly. Patient is aware about abdominal mass and agreed for workup    # AKI (acute kidney injury) (HCC) Cr baseline is 1.0-1.2, here up to 1.6 due to DKA sCr 1.13 improved after IV fluid -Continue IV fluids - Trend Cr - Hold diuretics, losartan      # Liver lesions # Adrenal nodule Incidentally noted multiple hyperenhancing liver lesions. - Radiology recommend: When the patient is clinically stable and able to follow directions and hold their breath (preferably as an outpatient) further evaluation with dedicated  abdominal MRI should be considered. - Also recommended is nonemergent adrenal washout CT or chemical shift MRI for further assessment of adrenal nodule       # Mixed hyperlipidemia - Continue Lipitor   # Essential hypertension - Hold furosemide , losartan -hydrochlorothiazide given AKI -Continue IV fluids Use hydralazine  as needed Monitor BP and titrate medication accordingly    # Obesity class II, down with Mounjaro , Body mass index is 39.69 kg/m.  Interventions: Calorie restricted diet and daily exercise advised to lose body weight.  Lifestyle modification discussed.   Diet: Carb modified diet DVT Prophylaxis: Subcutaneous Lovenox    Advance goals of care discussion: Full code  Family Communication: family was not present at bedside, at the time of interview.  The pt provided permission to discuss medical plan with the family. Opportunity was given to ask question and all questions were answered satisfactorily.   Disposition:  Pt is from home, admitted with DKA, still has high blood glucose, which precludes a safe discharge. Discharge to home, when stable, most likely in 1 to 2 days.  Subjective: No significant events overnight, currently patient denies any nausea vomiting or abdominal pain.  Feeling a lot better.  Denied any active issues.   Physical Exam: General: NAD, lying comfortably Appear in no distress, affect appropriate Eyes: PERRLA ENT: Oral Mucosa Clear, moist  Neck: no JVD,  Cardiovascular: S1 and S2 Present, no Murmur,  Respiratory: good respiratory effort, Bilateral Air entry equal and Decreased, no Crackles, no wheezes Abdomen: Bowel Sound present, Soft and no tenderness,  Skin:  no rashes Extremities: no Pedal edema, no calf tenderness Neurologic: without any new focal findings Gait not checked due to patient safety concerns  Vitals:   11/25/23 1000 11/25/23 1100 11/25/23 1200 11/25/23 1300  BP: (!) 133/100 (!) 138/102 132/85 (!) 130/94  Pulse: 73  71  75  Resp: 14 11 16 14   Temp:   97.7 F (36.5 C)   TempSrc:   Axillary   SpO2: 99% 98% 98% 99%  Weight:      Height:        Intake/Output Summary (Last 24 hours) at 11/25/2023 1353 Last data filed at 11/25/2023 1253 Gross per 24 hour  Intake 2521.42 ml  Output --  Net 2521.42 ml   Filed Weights   11/24/23 1947  Weight: 98.4 kg    Data Reviewed: I have personally reviewed and interpreted daily labs, tele strips, imagings as discussed above. I reviewed all nursing notes, pharmacy notes, vitals, pertinent old records I have discussed plan of care as described above with RN and patient/family.  CBC: Recent Labs  Lab 11/24/23 2001  WBC 8.8  NEUTROABS 5.6  HGB 13.2  HCT 40.1  MCV 87.6  PLT 348   Basic Metabolic Panel: Recent Labs  Lab 11/24/23 2001 11/24/23 2135 11/25/23 0150 11/25/23 0436 11/25/23 1001  NA 133* 133* 141 139 137  K 4.8 4.8 4.5 4.3 3.5  CL 93* 97* 102 102 103  CO2 18* 16* 21* 21* 23  GLUCOSE 516* 462* 189* 197* 162*  BUN 33* 30* 29* 27* 24*  CREATININE 1.64* 1.42* 1.32* 1.13* 1.05*  CALCIUM  10.6* 9.9 10.4* 10.1 10.0  MG  --   --   --   --  2.0  PHOS  --   --   --   --  2.5    Studies: CT Chest W Contrast Result Date: 11/24/2023 CLINICAL DATA:  Abnormal chest x-ray EXAM: CT CHEST WITH CONTRAST TECHNIQUE: Multidetector CT imaging of the chest was performed during intravenous contrast administration. RADIATION DOSE REDUCTION: This exam was performed according to the departmental dose-optimization program which includes automated exposure control, adjustment of the mA and/or kV according to patient size and/or use of iterative reconstruction technique. CONTRAST:  75mL OMNIPAQUE  IOHEXOL  300 MG/ML  SOLN COMPARISON:  Chest x-ray 11/24/2023, 10/02/2023, CT 10/22/2011 FINDINGS: Cardiovascular: No aneurysmal dilatation. Normal cardiac size. No pericardial effusion Mediastinum/Nodes: Patent trachea. No suspicious thyroid mass. 13 mm right thyroid nodule, no  imaging follow-up is recommended. No suspicious mediastinal lymph nodes. No evidence for AP window mass. Esophagus is normal Lungs/Pleura: No acute airspace disease, pleural effusion or pneumothorax Upper Abdomen: Hepatic steatosis. Hyperenhancing 3 cm left hepatic mass. Additional smaller hyperenhancing lesions in the right hepatic lobe, for example 13 mm lesion on series 2, image 26 6, 6 mm lesion anteriorly on series 2, image 20, and 13 mm lesion on series 2, image 16. Interval right adrenal nodule measuring 18 mm, density value 52. Diffuse thickening of the left adrenal gland. 2.5 cm left adrenal mass, previously 2 cm. Incompletely visualized right and left mid abdominal solid appearing masses, on the right measuring up to 8.2 cm series 2, image 67 and on the left measuring about 5 cm. These could be uterine or adnexal in origin. Musculoskeletal: No acute osseous abnormality. Multilevel bulky osteophytes. Sclerosis of the right tenth medial rib with deformity, could be due to remote injury and degenerative change, this is felt to account for sclerotic lesion at the lower thoracic spine on radiography. IMPRESSION: 1.  No evidence for AP window mass or suspicious lung mass. 2. Incompletely visualized right and left mid abdominal solid appearing masses, on the right measuring up to 8.2 cm and on the left measuring about 5 cm. These could be uterine or adnexal in origin. Dedicated abdominopelvic CT versus pelvic ultrasound attempt is suggested for further assessment 3. Hepatic steatosis. Multiple hyperenhancing liver lesions, largest measuring 3 cm in the left hepatic lobe. When the patient is clinically stable and able to follow directions and hold their breath (preferably as an outpatient) further evaluation with dedicated abdominal MRI should be considered. 4. Interval development of right adrenal nodule measuring up to 18 mm and slight interval increase in size of left adrenal nodule. Recommend nonemergent  adrenal washout CT or chemical shift MRI for further assessment. 5. Sclerosis of the right tenth medial rib with deformity, could be due to remote injury and degenerative change, this is felt to account for sclerotic lesion at the lower thoracic spine on radiography. No suspicious sclerotic vertebral lesions are seen. Electronically Signed   By: Luke Bun M.D.   On: 11/24/2023 21:49   DG Chest 2 View Result Date: 11/24/2023 CLINICAL DATA:  Shortness of breath EXAM: CHEST - 2 VIEW COMPARISON:  10/02/2023 FINDINGS: No acute airspace disease, pleural effusion, or pneumothorax. Similar subtle fullness at the AP window. Normal cardiac size. Possible sclerosis at the posterior aspect of T10 as was seen on prior exam. There may be sclerosis at the T5 vertebral. IMPRESSION: 1. No active cardiopulmonary disease. 2. Similar subtle fullness at the AP window, with additional findings of possible sclerosis at T5 and T10. Recommend contrast-enhanced chest CT for further evaluation. Electronically Signed   By: Luke Bun M.D.   On: 11/24/2023 20:06    Scheduled Meds:  atorvastatin   20 mg Oral Daily   [START ON 11/26/2023] Chlorhexidine  Gluconate Cloth  6 each Topical QHS   enoxaparin  (LOVENOX ) injection  0.5 mg/kg Subcutaneous Q24H   insulin  aspart  0-15 Units Subcutaneous TID WC   insulin  glargine-yfgn  15 Units Subcutaneous Q24H   polyethylene glycol  17 g Oral Daily   senna-docusate  1 tablet Oral BID   Continuous Infusions:  sodium chloride      dextrose  5% lactated ringers  125 mL/hr at 11/25/23 1253   insulin  4.6 Units/hr (11/25/23 1253)   lactated ringers  Stopped (11/24/23 2208)   PRN Meds: acetaminophen  **OR** acetaminophen , dextrose , ondansetron  **OR** ondansetron  (ZOFRAN ) IV  Time spent: 55 minutes  Author: ELVAN SOR. MD [facility] 11/25/2023 1:53 PM  To reach On-call, see care teams to locate the attending and reach out to them via [email or web address]. If 7PM-7AM, please contact  night-coverage If you still have difficulty reaching the attending provider, please page the [facility] (Director on Call) for [facility] on amion for assistance.

## 2023-11-25 NOTE — Inpatient Diabetes Management (Addendum)
 Inpatient Diabetes Program Recommendations  AACE/ADA: New Consensus Statement on Inpatient Glycemic Control (2015)  Target Ranges:  Prepandial:   less than 140 mg/dL      Peak postprandial:   less than 180 mg/dL (1-2 hours)      Critically ill patients:  140 - 180 mg/dL    Latest Reference Range & Units 11/24/23 20:01  Glucose 70 - 99 mg/dL 483 (HH)  (HH): Data is critically high  Latest Reference Range & Units 08/04/23 11:26  Hemoglobin A1C 4.8 - 5.6 % 7.7 (H)  (H): Data is abnormally high  Latest Reference Range & Units 11/24/23 21:35 11/25/23 01:50 11/25/23 04:36  Beta-Hydroxybutyric Acid 0.05 - 0.27 mmol/L 6.51 (H) 2.59 (H) 1.90 (H)  (H): Data is abnormally high  Latest Reference Range & Units 11/24/23 20:05 11/24/23 21:48 11/24/23 22:18 11/24/23 22:20 11/24/23 22:48 11/25/23 00:17 11/25/23 01:27 11/25/23 02:30 11/25/23 03:31 11/25/23 04:30  Glucose-Capillary 70 - 99 mg/dL 513 (H) 585 (H)  IV Insulin  Drip Started 441 (H) 466 (H) 414 (H) 335 (H) 218 (H) 195 (H) 212 (H) 196 (H)  (H): Data is abnormally high  Latest Reference Range & Units 11/25/23 05:35 11/25/23 06:32 11/25/23 07:25  Glucose-Capillary 70 - 99 mg/dL 778 (H) 806 (H) 813 (H)  IV Insulin  Drip Running  (H): Data is abnormally high    Admit with: DKA/ Abd Mass/ Liver lesions/ Adrenal nodule  History: DM2  Home DM Meds: Metformin  500 mg BID       Ozempic  0.25 mg Qweek  Current Orders: IV Insulin  Drip    PCP: [facility]--Amber Scoggins, NP Last Seen 10/13/2023 Mounjaro  was denied by insurance Rx for Ozempic  sent in July 2025   MD- Note 9am BMET and BHB levels to be drawn this AM  Would wait to see results from latest labs before transition  If 9am labs are OK and pt ready to transition to SQ Insulin , please make sure pt receives basal insulin  2 hours prior to d/c of the IV Insulin  Drip  Recommend weight based dosing to start: Lantus  15 units daily (0.15 units/kg) Novolog  Moderate  Correction Scale/ SSI (0-15 units) TID AC + HS   Addendum 11am--Met w/ pt at bedside.  Pt told me she was feeling weak and tired for about 1 week PTA.  Noted increased thirst and urination.  Has not been checking CBGs this past week due to feeling bad but has CBG meter at home.  Pt told me she did not take her Metformin  this past week due to feeling poorly as well.  Has Ozempic  at home but has not yet started this medication--PCP gave pt a Rx for Ozempic  back in July.  Pt plans to start the Ozempic  when she goes home.    Discussed with patient diagnosis of DKA (pathophysiology), treatment of DKA, lab results, and transition plan to SQ insulin  regimen.  Pt appreciative of info and asked me when she can eat.  Explained to pt that we will order a PO diet for her as soon as she comes off the IV Insulin  Drip.  Pt stated she has f/u appt with PCP on Sept 4th.       --Will follow patient during hospitalization--  Adina Rudolpho Arrow RN, MSN, CDCES Diabetes Coordinator Inpatient Glycemic Control Team Team Pager: [phone] (8a-5p)

## 2023-11-26 ENCOUNTER — Inpatient Hospital Stay

## 2023-11-26 DIAGNOSIS — E1110 Type 2 diabetes mellitus with ketoacidosis without coma: Secondary | ICD-10-CM | POA: Diagnosis not present

## 2023-11-26 LAB — CBC
HCT: 34.8 % — ABNORMAL LOW (ref 36.0–46.0)
Hemoglobin: 11.3 g/dL — ABNORMAL LOW (ref 12.0–15.0)
MCH: 28.3 pg (ref 26.0–34.0)
MCHC: 32.5 g/dL (ref 30.0–36.0)
MCV: 87.2 fL (ref 80.0–100.0)
Platelets: 291 K/uL (ref 150–400)
RBC: 3.99 MIL/uL (ref 3.87–5.11)
RDW: 15 % (ref 11.5–15.5)
WBC: 6.8 K/uL (ref 4.0–10.5)
nRBC: 0 % (ref 0.0–0.2)

## 2023-11-26 LAB — HEPATIC FUNCTION PANEL
ALT: 22 U/L (ref 0–44)
AST: 32 U/L (ref 15–41)
Albumin: 3 g/dL — ABNORMAL LOW (ref 3.5–5.0)
Alkaline Phosphatase: 79 U/L (ref 38–126)
Bilirubin, Direct: <0.1 mg/dL (ref 0.0–0.2)
Total Bilirubin: 0.5 mg/dL (ref 0.0–1.2)
Total Protein: 6.7 g/dL (ref 6.5–8.1)

## 2023-11-26 LAB — MAGNESIUM: Magnesium: 1.8 mg/dL (ref 1.7–2.4)

## 2023-11-26 LAB — BASIC METABOLIC PANEL WITH GFR
Anion gap: 9 (ref 5–15)
BUN: 22 mg/dL — ABNORMAL HIGH (ref 6–20)
CO2: 21 mmol/L — ABNORMAL LOW (ref 22–32)
Calcium: 9.3 mg/dL (ref 8.9–10.3)
Chloride: 105 mmol/L (ref 98–111)
Creatinine, Ser: 0.78 mg/dL (ref 0.44–1.00)
GFR, Estimated: >60 mL/min (ref >60)
Glucose, Bld: 252 mg/dL — ABNORMAL HIGH (ref 70–99)
Potassium: 3.9 mmol/L (ref 3.5–5.1)
Sodium: 135 mmol/L (ref 135–145)

## 2023-11-26 LAB — GLUCOSE, CAPILLARY
Glucose-Capillary: 332 mg/dL — ABNORMAL HIGH (ref 70–99)
Glucose-Capillary: 318 mg/dL — ABNORMAL HIGH (ref 70–99)
Glucose-Capillary: 307 mg/dL — ABNORMAL HIGH (ref 70–99)
Glucose-Capillary: 371 mg/dL — ABNORMAL HIGH (ref 70–99)

## 2023-11-26 LAB — PHOSPHORUS: Phosphorus: 2 mg/dL — ABNORMAL LOW (ref 2.5–4.6)

## 2023-11-26 MED ORDER — INSULIN ASPART 100 UNIT/ML IJ SOLN
4.0000 [IU] | Freq: Three times a day (TID) | INTRAMUSCULAR | Status: DC
  Administered 2023-11-26 – 2023-11-27 (×3): 4 [IU] via SUBCUTANEOUS
  Filled 2023-11-26 (×3): qty 1

## 2023-11-26 MED ORDER — SODIUM CHLORIDE 0.9 % IV SOLN: INTRAVENOUS | Status: AC

## 2023-11-26 MED ORDER — IOHEXOL 9 MG/ML PO SOLN
500.0000 mL | ORAL | Status: AC
  Administered 2023-11-26 (×2): 500 mL via ORAL

## 2023-11-26 MED ORDER — LOSARTAN POTASSIUM 50 MG PO TABS
50.0000 mg | ORAL_TABLET | Freq: Every day | ORAL | Status: DC
  Administered 2023-11-26 – 2023-11-27 (×2): 50 mg via ORAL
  Filled 2023-11-26 (×2): qty 1

## 2023-11-26 MED ORDER — ALUM & MAG HYDROXIDE-SIMETH 200-200-20 MG/5ML PO SUSP
15.0000 mL | Freq: Four times a day (QID) | ORAL | Status: DC | PRN
  Administered 2023-11-26: 15 mL via ORAL
  Filled 2023-11-26: qty 30

## 2023-11-26 MED ORDER — POTASSIUM PHOSPHATES 15 MMOLE/5ML IV SOLN: 30.0000 mmol | Freq: Once | INTRAVENOUS | Status: DC

## 2023-11-26 MED ORDER — K PHOS MONO-SOD PHOS DI & MONO 155-852-130 MG PO TABS
500.0000 mg | ORAL_TABLET | Freq: Four times a day (QID) | ORAL | Status: AC
  Administered 2023-11-26 (×3): 500 mg via ORAL
  Filled 2023-11-26 (×3): qty 2

## 2023-11-26 MED ORDER — INSULIN GLARGINE-YFGN 100 UNIT/ML ~~LOC~~ SOLN
20.0000 [IU] | SUBCUTANEOUS | Status: DC
  Administered 2023-11-27: 20 [IU] via SUBCUTANEOUS
  Filled 2023-11-26: qty 0.2

## 2023-11-26 MED ORDER — IOHEXOL 300 MG/ML  SOLN
100.0000 mL | Freq: Once | INTRAMUSCULAR | Status: AC | PRN
  Administered 2023-11-26: 100 mL via INTRAVENOUS

## 2023-11-26 NOTE — Plan of Care (Signed)
  Problem: Education: Goal: Knowledge of General Education information will improve Description: Including pain rating scale, medication(s)/side effects and non-pharmacologic comfort measures Outcome: Progressing   Problem: Health Behavior/Discharge Planning: Goal: Ability to manage health-related needs will improve Outcome: Progressing   Problem: Activity: Goal: Risk for activity intolerance will decrease Outcome: Progressing   Problem: Elimination: Goal: Will not experience complications related to bowel motility Outcome: Progressing Goal: Will not experience complications related to urinary retention Outcome: Progressing   Problem: Pain Managment: Goal: General experience of comfort will improve and/or be controlled Outcome: Progressing   Problem: Education: Goal: Ability to describe self-care measures that may prevent or decrease complications (Diabetes Survival Skills Education) will improve Outcome: Progressing Goal: Individualized Educational Video(s) Outcome: Progressing   Problem: Coping: Goal: Ability to adjust to condition or change in health will improve Outcome: Progressing

## 2023-11-26 NOTE — Progress Notes (Signed)
 [facility] Progress Note  Patient: Vanessa Wyatt    FMW:[id number]  DOA: 11/24/2023     Date of Service: the patient was seen and examined on 11/26/2023  Chief Complaint  Patient presents with   Shortness of Breath   Brief hospital course: 58 y.o. F with DM, HTN, obesity, headaches/tinnitus who presented with SOB, nausea, malaise for several days ago.   Few days ago started to have malaise, polyuria, felt weak and tired like she was dehydrated.  Was drinking a lot of water, apple juice, and cranberry juice, and seemed to be getting better until this morning, woke up with generalized weakness, feeling a lot worse, nausea, abdominal discomfort, so came to the ER.   In the ER, glucose 562, bicarb 18, AG 22.  Cr 1.6 from baseline 1.1.  Abnormal CT chest noted.   Started on fluids, insulin  drip  and hospitalists called for DKA.  8/29 DKA resolved, patient was transition to subcu insulin .  Further management as below.   Assessment and Plan:  # DKA (diabetic ketoacidosis) (HCC) Presented with hyperglycemia, acidosis, mild.   S/p IV insulin  infusion.  DKA resolved, anion gap closed Transition to subcu insulin , started Semglee  15 units daily, NovoLog  sliding scale 8/30 increased Semglee  20 units daily and started NovoLog  4 units 3 times daily with meals, continue NovoLog  sliding scale. Monitor CBG, continue diabetic Continue IV fluids Follow DM coordinator     # Abdominal mass This was an incidental finding. Patient will need CTAP vs TVUS 8/30 sCr 0.78 back to normal Follow-up CT A/P to rule out abdominal masses  # Hypophosphatemia, Phos repleted. Monitor electrolytes daily   # AKI (acute kidney injury) (HCC) resolved Cr baseline is 1.0-1.2, here up to 1.6 due to DKA sCr 1.13>>0.78 improved after IV fluid -Continue IV fluids - Held diuretics and losartan  on admission 8/30 resumed losartan  50 mg p.o. daily with holding parameters     # Liver lesions # Adrenal  nodule Incidentally noted multiple hyperenhancing liver lesions. - Radiology recommend: When the patient is clinically stable and able to follow directions and hold their breath (preferably as an outpatient) further evaluation with dedicated abdominal MRI should be considered. - Also recommended is nonemergent adrenal washout CT or chemical shift MRI for further assessment of adrenal nodule       # Mixed hyperlipidemia - Continue Lipitor   # Essential hypertension - Hold furosemide , losartan -hydrochlorothiazide given AKI -Continue IV fluids Use hydralazine  as needed Monitor BP and titrate medication accordingly 8/30 resumed losartan  50 mg p.o. daily with holding parameters    # Obesity class II, down with Mounjaro , Body mass index is 39.69 kg/m.  Interventions: Calorie restricted diet and daily exercise advised to lose body weight.  Lifestyle modification discussed.   Diet: Carb modified diet DVT Prophylaxis: Subcutaneous Lovenox    Advance goals of care discussion: Full code  Family Communication: family was not present at bedside, at the time of interview.  The pt provided permission to discuss medical plan with the family. Opportunity was given to ask question and all questions were answered satisfactorily.   Disposition:  Pt is from home, admitted with DKA, still has high blood glucose, found to have abdominal mass, workup pending, which precludes a safe discharge. Discharge to home, when stable, most likely in 1 to 2 days.  Subjective: No significant events overnight, currently patient denies any nausea vomiting or abdominal pain.  Feeling a lot better.  Denied any active issues. Patient agreed for CT scan  of abdomen to rule out mass  Physical Exam: General: NAD, lying comfortably Appear in no distress, affect appropriate Eyes: PERRLA ENT: Oral Mucosa Clear, moist  Neck: no JVD,  Cardiovascular: S1 and S2 Present, no Murmur,  Respiratory: good respiratory effort,  Bilateral Air entry equal and Decreased, no Crackles, no wheezes Abdomen: Bowel Sound present, Soft and no tenderness,  Skin: no rashes Extremities: no Pedal edema, no calf tenderness Neurologic: without any new focal findings Gait not checked due to patient safety concerns  Vitals:   11/26/23 0405 11/26/23 0751 11/26/23 0823 11/26/23 1209  BP: (!) 145/73 (!) 144/92 (!) 141/82 (!) 134/90  Pulse: 73 72  71  Resp: 19 20  20   Temp: 98.5 F (36.9 C) 98.8 F (37.1 C)  98.6 F (37 C)  TempSrc:  Oral    SpO2: 100% 97%  100%  Weight:      Height:        Intake/Output Summary (Last 24 hours) at 11/26/2023 1317 Last data filed at 11/26/2023 0900 Gross per 24 hour  Intake 514.38 ml  Output 300 ml  Net 214.38 ml   Filed Weights   11/24/23 1947  Weight: 98.4 kg    Data Reviewed: I have personally reviewed and interpreted daily labs, tele strips, imagings as discussed above. I reviewed all nursing notes, pharmacy notes, vitals, pertinent old records I have discussed plan of care as described above with RN and patient/family.  CBC: Recent Labs  Lab 11/24/23 2001 11/26/23 0453  WBC 8.8 6.8  NEUTROABS 5.6  --   HGB 13.2 11.3*  HCT 40.1 34.8*  MCV 87.6 87.2  PLT 348 291   Basic Metabolic Panel: Recent Labs  Lab 11/25/23 0150 11/25/23 0436 11/25/23 1001 11/25/23 1349 11/26/23 0453  NA 141 139 137 136 135  K 4.5 4.3 3.5 3.6 3.9  CL 102 102 103 104 105  CO2 21* 21* 23 23 21*  GLUCOSE 189* 197* 162* 173* 252*  BUN 29* 27* 24* 22* 22*  CREATININE 1.32* 1.13* 1.05* 0.99 0.78  CALCIUM  10.4* 10.1 10.0 9.6 9.3  MG  --   --  2.0  --  1.8  PHOS  --   --  2.5  --  2.0*    Studies: No results found.   Scheduled Meds:  atorvastatin   20 mg Oral Daily   enoxaparin  (LOVENOX ) injection  0.5 mg/kg Subcutaneous Q24H   insulin  aspart  0-15 Units Subcutaneous TID AC & HS   insulin  aspart  4 Units Subcutaneous TID WC   [START ON 11/27/2023] insulin  glargine-yfgn  20 Units  Subcutaneous Q24H   phosphorus  500 mg Oral QID   polyethylene glycol  17 g Oral Daily   senna-docusate  1 tablet Oral BID   Continuous Infusions:  sodium chloride  50 mL/hr at 11/26/23 1049   PRN Meds: acetaminophen  **OR** acetaminophen , alum & mag hydroxide-simeth, dextrose , hydrALAZINE  **OR** hydrALAZINE , ondansetron  **OR** ondansetron  (ZOFRAN ) IV  Time spent: 55 minutes  Author: ELVAN SOR. MD [facility] 11/26/2023 1:17 PM  To reach On-call, see care teams to locate the attending and reach out to them via [email or web address]. If 7PM-7AM, please contact night-coverage If you still have difficulty reaching the attending provider, please page the [facility] (Director on Call) for [facility] on amion for assistance.

## 2023-11-26 NOTE — Inpatient Diabetes Management (Signed)
 Inpatient Diabetes Program Recommendations  AACE/ADA: New Consensus Statement on Inpatient Glycemic Control (2015)  Target Ranges:  Prepandial:   less than 140 mg/dL      Peak postprandial:   less than 180 mg/dL (1-2 hours)      Critically ill patients:  140 - 180 mg/dL    Latest Reference Range & Units 11/25/23 12:38 11/25/23 13:55 11/25/23 16:30 11/25/23 21:41 11/25/23 23:52  Glucose-Capillary 70 - 99 mg/dL  15 units Lantus  @1150  166 (H) 184 (H) 304 (H)  11 units Novolog   336 (H) 340 (H)  11 units Novolog    (H): Data is abnormally high  Latest Reference Range & Units 11/26/23 07:48  Glucose-Capillary 70 - 99 mg/dL 667 (H)  11 units Novolog    (H): Data is abnormally high   Home DM Meds: Metformin  500 mg BID                             Ozempic  0.25 mg Qweek   Current Orders: Lantus  15 units Q24H     Novolog  Moderate Correction Scale/ SSI (0-15 units) TID AC + HS     MD- Note CBG 332 this AM.  CBGs quite high in the afternoon yest as well  Please consider:  1. Increase Lantus  to 20 units Q24H  2. Start Novolog  Meal Coverage: Novolog  4 units TID with meals HOLD if pt NPO HOLD if pt eats <50% meals  Pt told me she did not take her Metformin  this past week due to feeling poorly as well.  Has Ozempic  at home but has not yet started this medication PCP gave pt a Rx for Ozempic  back in July Pt plans to start the Ozempic  when she goes home.      --Will follow patient during hospitalization--  Adina Rudolpho Arrow RN, MSN, CDCES Diabetes Coordinator Inpatient Glycemic Control Team Team Pager: [phone] (8a-5p)

## 2023-11-27 ENCOUNTER — Inpatient Hospital Stay

## 2023-11-27 DIAGNOSIS — E1110 Type 2 diabetes mellitus with ketoacidosis without coma: Secondary | ICD-10-CM | POA: Diagnosis not present

## 2023-11-27 LAB — BASIC METABOLIC PANEL WITH GFR
Anion gap: 10 (ref 5–15)
BUN: 16 mg/dL (ref 6–20)
CO2: 22 mmol/L (ref 22–32)
Calcium: 8.7 mg/dL — ABNORMAL LOW (ref 8.9–10.3)
Chloride: 106 mmol/L (ref 98–111)
Creatinine, Ser: 0.58 mg/dL (ref 0.44–1.00)
GFR, Estimated: >60 mL/min (ref >60)
Glucose, Bld: 162 mg/dL — ABNORMAL HIGH (ref 70–99)
Potassium: 3.4 mmol/L — ABNORMAL LOW (ref 3.5–5.1)
Sodium: 138 mmol/L (ref 135–145)

## 2023-11-27 LAB — CBC
HCT: 34.2 % — ABNORMAL LOW (ref 36.0–46.0)
Hemoglobin: 10.9 g/dL — ABNORMAL LOW (ref 12.0–15.0)
MCH: 28.2 pg (ref 26.0–34.0)
MCHC: 31.9 g/dL (ref 30.0–36.0)
MCV: 88.4 fL (ref 80.0–100.0)
Platelets: 257 K/uL (ref 150–400)
RBC: 3.87 MIL/uL (ref 3.87–5.11)
RDW: 15.2 % (ref 11.5–15.5)
WBC: 6.8 K/uL (ref 4.0–10.5)
nRBC: 0 % (ref 0.0–0.2)

## 2023-11-27 LAB — HEPATIC FUNCTION PANEL
ALT: 20 U/L (ref 0–44)
AST: 25 U/L (ref 15–41)
Albumin: 3 g/dL — ABNORMAL LOW (ref 3.5–5.0)
Alkaline Phosphatase: 75 U/L (ref 38–126)
Bilirubin, Direct: <0.1 mg/dL (ref 0.0–0.2)
Total Bilirubin: 0.6 mg/dL (ref 0.0–1.2)
Total Protein: 6.7 g/dL (ref 6.5–8.1)

## 2023-11-27 LAB — PHOSPHORUS: Phosphorus: 3.2 mg/dL (ref 2.5–4.6)

## 2023-11-27 LAB — MAGNESIUM: Magnesium: 1.9 mg/dL (ref 1.7–2.4)

## 2023-11-27 LAB — GLUCOSE, CAPILLARY
Glucose-Capillary: 162 mg/dL — ABNORMAL HIGH (ref 70–99)
Glucose-Capillary: 233 mg/dL — ABNORMAL HIGH (ref 70–99)

## 2023-11-27 MED ORDER — LANTUS 100 UNIT/ML ~~LOC~~ SOLN: 20.0000 [IU] | Freq: Every day | SUBCUTANEOUS | 0 refills | Status: DC

## 2023-11-27 MED ORDER — BLOOD GLUCOSE MONITORING SUPPL DEVI: 1.0000 | Freq: Three times a day (TID) | 0 refills | Status: AC

## 2023-11-27 MED ORDER — ACCU-CHEK AVIVA PLUS VI STRP
ORAL_STRIP | 0 refills | Status: AC
Start: 2023-11-27 — End: ?

## 2023-11-27 MED ORDER — POTASSIUM CHLORIDE CRYS ER 20 MEQ PO TBCR
40.0000 meq | EXTENDED_RELEASE_TABLET | Freq: Once | ORAL | Status: AC
  Administered 2023-11-27: 40 meq via ORAL
  Filled 2023-11-27: qty 2

## 2023-11-27 MED ORDER — INSULIN ASPART 100 UNIT/ML FLEXPEN: 0.0000 [IU] | PEN_INJECTOR | Freq: Three times a day (TID) | SUBCUTANEOUS | 0 refills | Status: DC

## 2023-11-27 MED ORDER — LANCETS MISC: 1.0000 | Freq: Three times a day (TID) | 0 refills | Status: DC

## 2023-11-27 MED ORDER — LANCET DEVICE MISC
1.0000 | Freq: Three times a day (TID) | 0 refills | Status: AC
Start: 2023-11-27 — End: ?

## 2023-11-27 MED ORDER — GADOBUTROL 1 MMOL/ML IV SOLN
10.0000 mL | Freq: Once | INTRAVENOUS | Status: AC | PRN
  Administered 2023-11-27: 10 mL via INTRAVENOUS

## 2023-11-27 MED ORDER — PEN NEEDLES 31G X 5 MM MISC: 1.0000 | Freq: Three times a day (TID) | 0 refills | Status: DC

## 2023-11-27 NOTE — Plan of Care (Signed)
 Problem: Education: Goal: Knowledge of General Education information will improve Description: Including pain rating scale, medication(s)/side effects and non-pharmacologic comfort measures Outcome: Adequate for Discharge   Problem: Health Behavior/Discharge Planning: Goal: Ability to manage health-related needs will improve Outcome: Adequate for Discharge   Problem: Clinical Measurements: Goal: Ability to maintain clinical measurements within normal limits will improve Outcome: Adequate for Discharge Goal: Will remain free from infection Outcome: Adequate for Discharge Goal: Diagnostic test results will improve Outcome: Adequate for Discharge Goal: Respiratory complications will improve Outcome: Adequate for Discharge Goal: Cardiovascular complication will be avoided Outcome: Adequate for Discharge   Problem: Activity: Goal: Risk for activity intolerance will decrease Outcome: Adequate for Discharge   Problem: Nutrition: Goal: Adequate nutrition will be maintained Outcome: Adequate for Discharge   Problem: Coping: Goal: Level of anxiety will decrease Outcome: Adequate for Discharge   Problem: Elimination: Goal: Will not experience complications related to bowel motility Outcome: Adequate for Discharge Goal: Will not experience complications related to urinary retention Outcome: Adequate for Discharge   Problem: Pain Managment: Goal: General experience of comfort will improve and/or be controlled Outcome: Adequate for Discharge   Problem: Safety: Goal: Ability to remain free from injury will improve Outcome: Adequate for Discharge   Problem: Skin Integrity: Goal: Risk for impaired skin integrity will decrease Outcome: Adequate for Discharge   Problem: Education: Goal: Ability to describe self-care measures that may prevent or decrease complications (Diabetes Survival Skills Education) will improve Outcome: Adequate for Discharge Goal: Individualized Educational  Video(s) Outcome: Adequate for Discharge   Problem: Coping: Goal: Ability to adjust to condition or change in health will improve Outcome: Adequate for Discharge   Problem: Fluid Volume: Goal: Ability to maintain a balanced intake and output will improve Outcome: Adequate for Discharge   Problem: Health Behavior/Discharge Planning: Goal: Ability to identify and utilize available resources and services will improve Outcome: Adequate for Discharge Goal: Ability to manage health-related needs will improve Outcome: Adequate for Discharge   Problem: Metabolic: Goal: Ability to maintain appropriate glucose levels will improve Outcome: Adequate for Discharge   Problem: Nutritional: Goal: Maintenance of adequate nutrition will improve Outcome: Adequate for Discharge Goal: Progress toward achieving an optimal weight will improve Outcome: Adequate for Discharge   Problem: Skin Integrity: Goal: Risk for impaired skin integrity will decrease Outcome: Adequate for Discharge   Problem: Tissue Perfusion: Goal: Adequacy of tissue perfusion will improve Outcome: Adequate for Discharge   Problem: Education: Goal: Ability to describe self-care measures that may prevent or decrease complications (Diabetes Survival Skills Education) will improve Outcome: Adequate for Discharge Goal: Individualized Educational Video(s) Outcome: Adequate for Discharge   Problem: Cardiac: Goal: Ability to maintain an adequate cardiac output will improve Outcome: Adequate for Discharge   Problem: Health Behavior/Discharge Planning: Goal: Ability to identify and utilize available resources and services will improve Outcome: Adequate for Discharge Goal: Ability to manage health-related needs will improve Outcome: Adequate for Discharge   Problem: Fluid Volume: Goal: Ability to achieve a balanced intake and output will improve Outcome: Adequate for Discharge   Problem: Metabolic: Goal: Ability to maintain  appropriate glucose levels will improve Outcome: Adequate for Discharge   Problem: Nutritional: Goal: Maintenance of adequate nutrition will improve Outcome: Adequate for Discharge Goal: Maintenance of adequate weight for body size and type will improve Outcome: Adequate for Discharge   Problem: Respiratory: Goal: Will regain and/or maintain adequate ventilation Outcome: Adequate for Discharge   Problem: Urinary Elimination: Goal: Ability to achieve and maintain adequate renal perfusion and  functioning will improve Outcome: Adequate for Discharge

## 2023-11-27 NOTE — Plan of Care (Signed)
 Patient has remained free from any noted signs of acute distress.  Has not required any prn medication for pain management during current shift.  Remains free from any noted signs of hyper/hypoglycemia.  Patient to continue to be monitored by hospital staff until discharge.

## 2023-11-27 NOTE — Discharge Summary (Signed)
 [facility] Discharge Summary   Patient: Vanessa Wyatt FMW:[id number]  PCP: Carin Gauze, NP  Date of admission: 11/24/2023   Date of discharge:  11/27/2023     Discharge Diagnoses:  Principal Problem:   DKA (diabetic ketoacidosis) (HCC) Active Problems:   Primary hypertension   Mixed hyperlipidemia   Class 3 severe obesity due to excess calories with serious comorbidity and body mass index (BMI) of 40.0 to 44.9 in adult   AKI (acute kidney injury) (HCC)   Abdominal mass   Liver lesions   Admitted From: Home Disposition:  Home   Recommendations for Outpatient Follow-up:  Follow-up with PCP in 1 week, continue to monitor CBG at home, continue diabetic diet and follow with PCP and endocrine for further management of diabetes.  Started on Lantus  and NovoLog  sliding scale.  Continued home meds Follow-up with GYN for uterine fibroids Follow-up with PCP to get an MRI done for liver and left adrenal lesions. Follow up LABS/TEST:  MRI as above   Follow-up Information     Scoggins, Amber, NP Follow up in 1 week(s).   Specialty: Cardiology Why: Needs MRI Liver and Left adrenal lesions. fu GYN for fibroids Contact information: [address] [place] [address] [phone]                Diet recommendation: Cardiac and Carb modified diet  Activity: The patient is advised to gradually reintroduce usual activities, as tolerated  Discharge Condition: stable  Code Status: Full code   History of present illness: As per the H and P dictated on admission.  Hospital Course:   58 y.o. F with DM, HTN, obesity, headaches/tinnitus who presented with SOB, nausea, malaise for several days ago.   Few days ago started to have malaise, polyuria, felt weak and tired like she was dehydrated.  Was drinking a lot of water, apple juice, and cranberry juice, and seemed to be getting better until this morning, woke up with generalized weakness, feeling a lot worse, nausea,  abdominal discomfort, so came to the ER.   In the ER, glucose 562, bicarb 18, AG 22.  Cr 1.6 from baseline 1.1.  Abnormal CT chest noted.   Started on fluids, insulin  drip  and hospitalists called for DKA.   8/29 DKA resolved, patient was transition to subcu insulin .  Further management as below.     Assessment and Plan:   # DKA (diabetic ketoacidosis) Presented with hyperglycemia, acidosis, mild.   S/p IV insulin  infusion.  DKA resolved, anion gap closed Transition to subcu insulin , started Semglee  15 units daily, NovoLog  sliding scale 8/30 increased Semglee  20 units daily and started NovoLog  4 units 3 times daily with meals, continue NovoLog  sliding scale. 8/31 CBG well-controlled now, patient was discharged on Lantus  20 units and NovoLog  sliding scale.  Resumed home medications.  Advised to monitor CBG at home continue diabetic diet and follow with PCP for further management.  Patient may benefit from referral to endocrinologist.     # Uterine fibroids, incidental finding.   CT a/p shows multiple uterine fibroids, slowly growing since 2013.  Liver lesion and left adrenal lesion 8/31 discussed with patient regarding uterine fibroids, she was not aware that she had this diagnosis in the past Patient agreed with MRI abdomen pelvis for fibroids. MRI shows multiple fibroids, no any other significant findings Patient was referred to GYN as an outpatient  # Hypophosphatemia, Phos repleted.  Resolved   # AKI (acute kidney injury) (HCC) resolved Cr baseline is  1.0-1.2, here up to 1.6 due to DKA sCr 1.13>>0.78 improved after IV fluid Held diuretics and losartan  on admission.  Resumed on discharge   # Liver lesions # Adrenal nodule Incidentally noted multiple hyperenhancing liver lesions. - Radiology recommend: When the patient is clinically stable and able to follow directions and hold their breath (preferably as an outpatient) further evaluation with dedicated abdominal MRI should be  considered. - Also recommended is nonemergent adrenal washout CT or chemical shift MRI for further assessment of adrenal nodule 8/31 today MRI was done for uterine fibroids.  Another MRI needs to be done after 24 hours, patient decided to go home and get further workup done as an outpatient  Patient was advised to follow with PCP for MRI abd/pelvis to look for liver and adrenal lesion.  # Mixed hyperlipidemia: Continue Lipitor # Essential hypertension: Resumed home meds on discharge Monitor BP at home and follow with PCP.   # Obesity class II Body mass index is 39.69 kg/m.  Interventions: Calorie restricted diet and daily exercise advised to lose body weight.  Lifestyle modification discussed.  Body mass index is 39.69 kg/m.  Nutrition Interventions:  Patient was ambulatory without any assistance. On the day of the discharge the patient's vitals were stable, and no other acute medical condition were reported by patient. the patient was felt safe to be discharge at Home.  Consultants: None Procedures: None  Discharge Exam: General: Appear in no distress, no Rash; Oral Mucosa Clear, moist. Cardiovascular: S1 and S2 Present, no Murmur, Respiratory: normal respiratory effort, Bilateral Air entry present and no Crackles, no wheezes Abdomen: Bowel Sound present, Soft and no tenderness, no hernia Extremities: no Pedal edema, no calf tenderness Neurology: alert and oriented to time, place, and person affect appropriate.  Filed Weights   11/24/23 1947  Weight: 98.4 kg   Vitals:   11/27/23 0400 11/27/23 0733  BP: 125/67 (!) 154/74  Pulse: 69 70  Resp: 18 17  Temp: 97.9 F (36.6 C) 98.6 F (37 C)  SpO2: 100% 100%    DISCHARGE MEDICATION: Allergies as of 11/27/2023       Reactions   Benadryl [diphenhydramine] Anaphylaxis   Citrullus Vulgaris Anaphylaxis   Coconut (cocos Nucifera) Anaphylaxis   Flavoring Agent (non-screening) Anaphylaxis   Tomato Anaphylaxis   Other  Swelling   Berries, watermelon, coconut        Medication List     TAKE these medications    Accu-Chek Aviva Plus test strip Generic drug: glucose blood Use as instructed   acetaminophen  650 MG CR tablet Commonly known as: TYLENOL  Take 650 mg by mouth every 8 (eight) hours as needed for pain.   atorvastatin  20 MG tablet Commonly known as: LIPITOR Take 1 tablet (20 mg total) by mouth daily.   Blood Glucose Monitoring Suppl Devi 1 each by Does not apply route 3 (three) times daily. May dispense any manufacturer covered by patient's insurance.   cetirizine  10 MG tablet Commonly known as: ZYRTEC  Take 1 tablet (10 mg total) by mouth daily.   fluticasone  50 MCG/ACT nasal spray Commonly known as: FLONASE  Place 2 sprays into both nostrils daily.   furosemide  20 MG tablet Commonly known as: LASIX  Take 1 tablet (20 mg total) by mouth daily as needed.   insulin  aspart 100 UNIT/ML FlexPen Commonly known as: NOVOLOG  Inject 0-11 Units into the skin 3 (three) times daily with meals. Check Blood Glucose (BG) and inject per scale: BG <150= 0 unit; BG 150-200= 1 unit; BG  201-250= 3 unit; BG 251-300= 5 unit; BG 301-350= 7 unit; BG 351-400= 9 unit; BG >400= 11 unit and Call Primary Care.   Lancet Device Misc 1 each by Does not apply route 3 (three) times daily. May dispense any manufacturer covered by patient's insurance.   Lancets Misc 1 each by Does not apply route 3 (three) times daily. Use as directed to check blood sugar. May dispense any manufacturer covered by patient's insurance and fits patient's device.   Lantus  100 UNIT/ML injection Generic drug: insulin  glargine Inject 0.2 mLs (20 Units total) into the skin daily.   losartan -hydrochlorothiazide 50-12.5 MG tablet Commonly known as: Hyzaar Take 1 tablet by mouth daily.   metFORMIN  500 MG tablet Commonly known as: GLUCOPHAGE  Take 1 tablet (500 mg total) by mouth 2 (two) times daily with a meal.   omeprazole  20 MG  tablet Commonly known as: PriLOSEC  OTC Take 1 tablet (20 mg total) by mouth daily.   ondansetron  4 MG disintegrating tablet Commonly known as: ZOFRAN -ODT Take 1 tablet (4 mg total) by mouth every 8 (eight) hours as needed for nausea or vomiting.   Ozempic  (0.25 or 0.5 MG/DOSE) 2 MG/3ML Sopn Generic drug: Semaglutide (0.25 or 0.5MG /DOS) Inject 0.25 mg into the skin once a week.   Pen Needles 31G X 5 MM Misc 1 each by Does not apply route 3 (three) times daily. May dispense any manufacturer covered by patient's insurance.   SUMAtriptan  25 MG tablet Commonly known as: Imitrex  Take 1 tablet (25 mg total) by mouth once as needed for migraine. May repeat in 2 hours if headache persists or recurs.       Allergies  Allergen Reactions   Benadryl [Diphenhydramine] Anaphylaxis   Citrullus Vulgaris Anaphylaxis   Coconut (Cocos Nucifera) Anaphylaxis   Flavoring Agent (Non-Screening) Anaphylaxis   Tomato Anaphylaxis   Other Swelling    Berries, watermelon, coconut   Discharge Instructions     Ambulatory referral to Gynecology   Complete by: As directed    Call MD for:  difficulty breathing, headache or visual disturbances   Complete by: As directed    Call MD for:  extreme fatigue   Complete by: As directed    Call MD for:  persistant dizziness or light-headedness   Complete by: As directed    Call MD for:  persistant nausea and vomiting   Complete by: As directed    Call MD for:  severe uncontrolled pain   Complete by: As directed    Call MD for:  temperature >100.4   Complete by: As directed    Diet - low sodium heart healthy   Complete by: As directed    Diet Carb Modified   Complete by: As directed    Discharge instructions   Complete by: As directed    Follow-up with PCP in 1 week, continue to monitor CBG at home, continue diabetic diet and follow with PCP and endocrine for further management of diabetes.  Started on Lantus  and NovoLog  sliding scale.  Continued home  meds Follow-up with GYN for uterine fibroids Follow-up with PCP to get an MRI done for liver and left adrenal lesions.   Increase activity slowly   Complete by: As directed        The results of significant diagnostics from this hospitalization (including imaging, microbiology, ancillary and laboratory) are listed below for reference.    Significant Diagnostic Studies: MR PELVIS W WO CONTRAST Result Date: 11/27/2023 EXAM: MR PELVIS WITH AND WITHOUT INTRAVENOUS CONTRAST 11/27/2023  12:53:48 PM TECHNIQUE: Multiplanar multisequence MRI of the pelvis was performed with and without intravenous contrast. COMPARISON: None available. CLINICAL HISTORY: Multiple solid masses in the pelvis, many of which arise from the uterus, increased in size and number from 2013 and may represent fibroids. Further evaluation with MRI is suggested for confirmation. FINDINGS: UTERUS: Enlarged fibroid uterus containing multiple large exophytic pedunculated fibroids. The uterus measures 11.2 x 10.0 x 26.8 cm with a volume of 1500 cc. -Dominant pedunculated fibroid of the right side of uterine fundus measures 11.3 x 8.6 cm, image 17/6. This shows diffuse enhancement on the post-contrast images. -Pedunculated fibroid arising off the left side of uterine fundus measures 10.0 x 7.3 cm, image 15/6 and also shows diffuse enhancement. -Within the right lower uterine segment there is an intramural fibroid which is T2 hyperintense, mildly T1 hyperintense with absent contrast enhancement on the postcontrast images compatible with a degenerative fibroid. The endometrial stripe is suboptimally visualized due to the mass effect from the fibroids. VAGINA: The vaginal canal is unremarkable. RIGHT OVARY: Right ovary is normal in size measuring 2.2 x 2.5 x 3.4 cm. LEFT OVARY: The left ovary appears normal measuring 2.3 x 2.2 x 3.1 cm. FREE FLUID: There is a small amount of fluid within the right lower quadrant of the abdomen. No focal fluid  collections identified. BLADDER AND URETHRA: Unremarkable. VISUALIZED GASTROINTESTINAL TRACT: Unremarkable. LYMPH NODES: No lymphadenopathy. VASCULATURE: Unremarkable. BONES: No acute or suspicious osseous findings. IMPRESSION: 1. Enlarged fibroid uterus with multiple large exophytic pedunculated, subserosal, and intramural fibroids, including a dominant right fundal pedunculated fibroid measuring 11.3 x 8.6 cm and a left fundal pedunculated fibroid measuring 10.0 x 7.3 cm, both showing diffuse enhancement. 2. An intramural fibroid in the right lower uterine segment demonstrates features compatible with cystic degeneration, which be symptomatic. 3. Small amount of fluid within the right lower quadrant of the abdomen. No focal fluid collections identified. Electronically signed by: Waddell Calk MD 11/27/2023 01:40 PM EDT RP Workstation: [id number]   CT ABDOMEN PELVIS W CONTRAST Result Date: 11/26/2023 CLINICAL DATA:  Metastatic disease evaluation. EXAM: CT ABDOMEN AND PELVIS WITH CONTRAST TECHNIQUE: Multidetector CT imaging of the abdomen and pelvis was performed using the standard protocol following bolus administration of intravenous contrast. RADIATION DOSE REDUCTION: This exam was performed according to the departmental dose-optimization program which includes automated exposure control, adjustment of the mA and/or kV according to patient size and/or use of iterative reconstruction technique. CONTRAST:  OMNIPAQUE  IOHEXOL  300 MG/ML  SOLN COMPARISON:  10/22/2011, 11/24/2023. FINDINGS: Lower chest: No acute abnormality. Hepatobiliary: Fatty infiltration of the liver is noted. There is a hypervascular lesion in the left lobe of the liver, hepatic segment 4 a measuring 3.4 x 2.6 cm. Additional hypervascular lesions are noted in the right lobe of the liver measuring 1.4 cm and 1.3 cm. No biliary ductal dilatation. The gallbladder is without stones. Pancreas: Unremarkable. No pancreatic ductal dilatation or  surrounding inflammatory changes. Spleen: Normal in size without focal abnormality. Adrenals/Urinary Tract: The right adrenal gland is within normal limits. There is a complex adrenal nodule in the left measuring 2.2 x 1.9 cm with attenuation of 42 Hounsfield units. The kidneys enhance symmetrically. Renal calculi are present on the right. No hydronephrosis bilaterally. The bladder is within normal limits. Stomach/Bowel: The stomach is within normal limits. No bowel obstruction, free air, or pneumatosis. Scattered diverticula are present along the colon without evidence of diverticulitis. Appendix appears normal. Vascular/Lymphatic: Aortic atherosclerosis. No enlarged abdominal or pelvic lymph nodes.  Reproductive: Multiple fibroids are noted within the uterus. There are large bilateral masses measuring up to 10.6 x 6.8 cm on the left and 11.3 x 8.6 cm on the right. Additional masses are noted anterior to the uterus. Other: No abdominopelvic ascites. Musculoskeletal: Degenerative changes are present in the thoracolumbar spine. No acute osseous abnormality is seen. IMPRESSION: 1. Multiple solid masses in the pelvis, many of which arise from the uterus, increased in size and number from 2013 and may represent fibroids. Further evaluation with MRI is suggested for confirmation. 2. Complex left renal nodule, slightly increased from size from 2013 may also be better evaluated with MRI. 3. Hepatic steatosis. Multiple hypervascular lesions are noted in the liver which persists on delayed imaging. Differential diagnosis includes focal fatty sparing, hemangiomas or other lesions and may be better evaluated with MRI. 4. Right renal calculi. 5. Aortic atherosclerosis. Electronically Signed   By: Leita Birmingham M.D.   On: 11/26/2023 15:15   CT Chest W Contrast Result Date: 11/24/2023 CLINICAL DATA:  Abnormal chest x-ray EXAM: CT CHEST WITH CONTRAST TECHNIQUE: Multidetector CT imaging of the chest was performed during  intravenous contrast administration. RADIATION DOSE REDUCTION: This exam was performed according to the departmental dose-optimization program which includes automated exposure control, adjustment of the mA and/or kV according to patient size and/or use of iterative reconstruction technique. CONTRAST:  75mL OMNIPAQUE  IOHEXOL  300 MG/ML  SOLN COMPARISON:  Chest x-ray 11/24/2023, 10/02/2023, CT 10/22/2011 FINDINGS: Cardiovascular: No aneurysmal dilatation. Normal cardiac size. No pericardial effusion Mediastinum/Nodes: Patent trachea. No suspicious thyroid mass. 13 mm right thyroid nodule, no imaging follow-up is recommended. No suspicious mediastinal lymph nodes. No evidence for AP window mass. Esophagus is normal Lungs/Pleura: No acute airspace disease, pleural effusion or pneumothorax Upper Abdomen: Hepatic steatosis. Hyperenhancing 3 cm left hepatic mass. Additional smaller hyperenhancing lesions in the right hepatic lobe, for example 13 mm lesion on series 2, image 26 6, 6 mm lesion anteriorly on series 2, image 20, and 13 mm lesion on series 2, image 16. Interval right adrenal nodule measuring 18 mm, density value 52. Diffuse thickening of the left adrenal gland. 2.5 cm left adrenal mass, previously 2 cm. Incompletely visualized right and left mid abdominal solid appearing masses, on the right measuring up to 8.2 cm series 2, image 67 and on the left measuring about 5 cm. These could be uterine or adnexal in origin. Musculoskeletal: No acute osseous abnormality. Multilevel bulky osteophytes. Sclerosis of the right tenth medial rib with deformity, could be due to remote injury and degenerative change, this is felt to account for sclerotic lesion at the lower thoracic spine on radiography. IMPRESSION: 1. No evidence for AP window mass or suspicious lung mass. 2. Incompletely visualized right and left mid abdominal solid appearing masses, on the right measuring up to 8.2 cm and on the left measuring about 5 cm.  These could be uterine or adnexal in origin. Dedicated abdominopelvic CT versus pelvic ultrasound attempt is suggested for further assessment 3. Hepatic steatosis. Multiple hyperenhancing liver lesions, largest measuring 3 cm in the left hepatic lobe. When the patient is clinically stable and able to follow directions and hold their breath (preferably as an outpatient) further evaluation with dedicated abdominal MRI should be considered. 4. Interval development of right adrenal nodule measuring up to 18 mm and slight interval increase in size of left adrenal nodule. Recommend nonemergent adrenal washout CT or chemical shift MRI for further assessment. 5. Sclerosis of the right tenth medial rib with deformity,  could be due to remote injury and degenerative change, this is felt to account for sclerotic lesion at the lower thoracic spine on radiography. No suspicious sclerotic vertebral lesions are seen. Electronically Signed   By: Luke Bun M.D.   On: 11/24/2023 21:49   DG Chest 2 View Result Date: 11/24/2023 CLINICAL DATA:  Shortness of breath EXAM: CHEST - 2 VIEW COMPARISON:  10/02/2023 FINDINGS: No acute airspace disease, pleural effusion, or pneumothorax. Similar subtle fullness at the AP window. Normal cardiac size. Possible sclerosis at the posterior aspect of T10 as was seen on prior exam. There may be sclerosis at the T5 vertebral. IMPRESSION: 1. No active cardiopulmonary disease. 2. Similar subtle fullness at the AP window, with additional findings of possible sclerosis at T5 and T10. Recommend contrast-enhanced chest CT for further evaluation. Electronically Signed   By: Luke Bun M.D.   On: 11/24/2023 20:06    Microbiology: Recent Results (from the past 240 hours)  Resp panel by RT-PCR (RSV, Flu A&B, Covid) Anterior Nasal Swab     Status: None   Collection Time: 11/24/23  8:01 PM   Specimen: Anterior Nasal Swab  Result Value Ref Range Status   SARS Coronavirus 2 by RT PCR NEGATIVE  NEGATIVE Final    Comment: (NOTE) SARS-CoV-2 target nucleic acids are NOT DETECTED.  The SARS-CoV-2 RNA is generally detectable in upper respiratory specimens during the acute phase of infection. The lowest concentration of SARS-CoV-2 viral copies this assay can detect is 138 copies/mL. A negative result does not preclude SARS-Cov-2 infection and should not be used as the sole basis for treatment or other patient management decisions. A negative result may occur with  improper specimen collection/handling, submission of specimen other than nasopharyngeal swab, presence of viral mutation(s) within the areas targeted by this assay, and inadequate number of viral copies(<138 copies/mL). A negative result must be combined with clinical observations, patient history, and epidemiological information. The expected result is Negative.  Fact Sheet for Patients:  BloggerCourse.com  Fact Sheet for Healthcare Providers:  SeriousBroker.it  This test is no t yet approved or cleared by the [place]  FDA and  has been authorized for detection and/or diagnosis of SARS-CoV-2 by FDA under an Emergency Use Authorization (EUA). This EUA will remain  in effect (meaning this test can be used) for the duration of the COVID-19 declaration under Section 564(b)(1) of the Act, 21 U.S.C.section 360bbb-3(b)(1), unless the authorization is terminated  or revoked sooner.       Influenza A by PCR NEGATIVE NEGATIVE Final   Influenza B by PCR NEGATIVE NEGATIVE Final    Comment: (NOTE) The Xpert Xpress SARS-CoV-2/FLU/RSV plus assay is intended as an aid in the diagnosis of influenza from Nasopharyngeal swab specimens and should not be used as a sole basis for treatment. Nasal washings and aspirates are unacceptable for Xpert Xpress SARS-CoV-2/FLU/RSV testing.  Fact Sheet for Patients: BloggerCourse.com  Fact Sheet for Healthcare  Providers: SeriousBroker.it  This test is not yet approved or cleared by the [place]  FDA and has been authorized for detection and/or diagnosis of SARS-CoV-2 by FDA under an Emergency Use Authorization (EUA). This EUA will remain in effect (meaning this test can be used) for the duration of the COVID-19 declaration under Section 564(b)(1) of the Act, 21 U.S.C. section 360bbb-3(b)(1), unless the authorization is terminated or revoked.     Resp Syncytial Virus by PCR NEGATIVE NEGATIVE Final    Comment: (NOTE) Fact Sheet for Patients: BloggerCourse.com  Fact Sheet for  Healthcare Providers: SeriousBroker.it  This test is not yet approved or cleared by the [place]  FDA and has been authorized for detection and/or diagnosis of SARS-CoV-2 by FDA under an Emergency Use Authorization (EUA). This EUA will remain in effect (meaning this test can be used) for the duration of the COVID-19 declaration under Section 564(b)(1) of the Act, 21 U.S.C. section 360bbb-3(b)(1), unless the authorization is terminated or revoked.  Performed at [facility], [address] [place], [place] [address]   MRSA Next Gen by PCR, Nasal     Status: None   Collection Time: 11/24/23 11:02 PM   Specimen: Nasal Mucosa; Nasal Swab  Result Value Ref Range Status   MRSA by PCR Next Gen NOT DETECTED NOT DETECTED Final    Comment: (NOTE) The GeneXpert MRSA Assay (FDA approved for NASAL specimens only), is one component of a comprehensive MRSA colonization surveillance program. It is not intended to diagnose MRSA infection nor to guide or monitor treatment for MRSA infections. Test performance is not FDA approved in patients less than 56 years old. Performed at [facility], [address] [place], [place] [address]      Labs: CBC: Recent Labs  Lab 11/24/23 2001 11/26/23 0453 11/27/23 0447  WBC 8.8  6.8 6.8  NEUTROABS 5.6  --   --   HGB 13.2 11.3* 10.9*  HCT 40.1 34.8* 34.2*  MCV 87.6 87.2 88.4  PLT 348 291 257   Basic Metabolic Panel: Recent Labs  Lab 11/25/23 0436 11/25/23 1001 11/25/23 1349 11/26/23 0453 11/27/23 0447  NA 139 137 136 135 138  K 4.3 3.5 3.6 3.9 3.4*  CL 102 103 104 105 106  CO2 21* 23 23 21* 22  GLUCOSE 197* 162* 173* 252* 162*  BUN 27* 24* 22* 22* 16  CREATININE 1.13* 1.05* 0.99 0.78 0.58  CALCIUM  10.1 10.0 9.6 9.3 8.7*  MG  --  2.0  --  1.8 1.9  PHOS  --  2.5  --  2.0* 3.2   Liver Function Tests: Recent Labs  Lab 11/26/23 0453 11/27/23 0447  AST 32 25  ALT 22 20  ALKPHOS 79 75  BILITOT 0.5 0.6  PROT 6.7 6.7  ALBUMIN 3.0* 3.0*   No results for input(s): LIPASE, AMYLASE in the last 168 hours. No results for input(s): AMMONIA in the last 168 hours. Cardiac Enzymes: No results for input(s): CKTOTAL, CKMB, CKMBINDEX, TROPONINI in the last 168 hours. BNP (last 3 results) Recent Labs    11/24/23 2001  BNP 14.0   CBG: Recent Labs  Lab 11/26/23 1208 11/26/23 1645 11/26/23 1954 11/27/23 0728 11/27/23 1302  GLUCAP [phone]* [phone]* [phone]* [phone]* [phone]*    Time spent: 35 minutes  Signed:  Elvan Sor  [facility] 11/27/2023 1:51 PM

## 2023-11-27 NOTE — Progress Notes (Signed)
 Mobility Specialist - Progress Note     11/27/23 1134  Mobility  Activity Ambulated independently  Level of Assistance Independent after set-up  Assistive Device Front wheel walker  Distance Ambulated (ft) 160 ft  Range of Motion/Exercises Active  Activity Response Tolerated well  Mobility Referral Yes  Mobility visit 1 Mobility  Mobility Specialist Start Time (ACUTE ONLY) 1121  Mobility Specialist Stop Time (ACUTE ONLY) 1134  Mobility Specialist Time Calculation (min) (ACUTE ONLY) 13 min   Pt resting in recliner on RA upon entry. Pt STS and ambulates to hallway around NS Indep after set-up utilizing RW. Pt endorses no pain or dizzinea during ambulation. Pt returned to bed and left with needs in reach and bed alarm activated.   Guido Rumble Mobility Specialist 11/27/23, 11:44 AM

## 2023-11-27 NOTE — Progress Notes (Signed)
 MD order received in [facility] to discharge pt home today; verbally reviewed AVS with pt, Rxs escribed to the [facility] on [address] in [place], pt to pick up Rxs; no questions voiced at this time; pt discharged via wheelchair to [facility] where her ride was waiting on her for discharge

## 2023-11-29 ENCOUNTER — Ambulatory Visit: Payer: Self-pay | Admitting: Cardiology

## 2023-11-29 LAB — HEMOGLOBIN A1C
Hgb A1c MFr Bld: 12.7 % — ABNORMAL HIGH (ref 4.8–5.6)
Mean Plasma Glucose: 318 mg/dL

## 2023-12-01 ENCOUNTER — Ambulatory Visit: Payer: Self-pay | Admitting: Cardiology

## 2023-12-01 ENCOUNTER — Encounter: Payer: Self-pay | Admitting: Cardiology

## 2023-12-01 ENCOUNTER — Ambulatory Visit (INDEPENDENT_AMBULATORY_CARE_PROVIDER_SITE_OTHER): Admitting: Cardiology

## 2023-12-01 VITALS — BP 126/84 | HR 89 | Ht 62.0 in | Wt 227.6 lb

## 2023-12-01 DIAGNOSIS — E1110 Type 2 diabetes mellitus with ketoacidosis without coma: Secondary | ICD-10-CM | POA: Diagnosis not present

## 2023-12-01 DIAGNOSIS — E119 Type 2 diabetes mellitus without complications: Secondary | ICD-10-CM

## 2023-12-01 DIAGNOSIS — Z0130 Encounter for examination of blood pressure without abnormal findings: Secondary | ICD-10-CM

## 2023-12-01 DIAGNOSIS — Z1211 Encounter for screening for malignant neoplasm of colon: Secondary | ICD-10-CM | POA: Diagnosis not present

## 2023-12-01 LAB — POCT UA - MICROALBUMIN
Creatinine, POC: 100 mg/dL
Microalbumin Ur, POC: 150 mg/L

## 2023-12-01 MED ORDER — METFORMIN HCL 500 MG PO TABS: 500.0000 mg | ORAL_TABLET | Freq: Two times a day (BID) | ORAL | 3 refills | Status: AC

## 2023-12-01 MED ORDER — CETIRIZINE HCL 10 MG PO TABS: 10.0000 mg | ORAL_TABLET | Freq: Every day | ORAL | 3 refills | Status: AC

## 2023-12-01 MED ORDER — ATORVASTATIN CALCIUM 20 MG PO TABS: 20.0000 mg | ORAL_TABLET | Freq: Every day | ORAL | 3 refills | Status: AC

## 2023-12-01 MED ORDER — LOSARTAN POTASSIUM-HCTZ 50-12.5 MG PO TABS: 1.0000 | ORAL_TABLET | Freq: Every day | ORAL | 3 refills | Status: AC

## 2023-12-01 NOTE — Patient Instructions (Signed)
 [facility] at [facility] [address], [address] [place],  [place]  [address]  Main: [phone]

## 2023-12-01 NOTE — Progress Notes (Signed)
 Established Patient Office Visit  Subjective:  Patient ID: Vanessa Wyatt, female    DOB: 07-14-1965  Age: 58 y.o. MRN: [id number]  Chief Complaint  Patient presents with   Follow-up    6 week follow up    Patient in office for 6 week follow up, hospital follow up. Patient doing well, no complaints today. Was started on Ozempic  at previous visit, did not take first injection until yesterday due to being hospitalized. Will reassess tolerance in 4 weeks and possible up the dose at that time if patient is tolerating it.  Patient in hospital from 8/28 to 8/361 for DKA. Hgb A1c on 8/31 was 12.7. Patient admits to not taking her insulin  as prescribed.  Patient agrees to take medication as prescribed.  Has not scheduled mammogram, phone number given to patient to call to scheduled. Due for colon cancer screening, order sent for Cologuard.     No other concerns at this time.   Past Medical History:  Diagnosis Date   Arthritis    bilateral legs   Diabetes (HCC)    Hyperlipidemia    Hypertension     No past surgical history on file.  Social History   Socioeconomic History   Marital status: Divorced    Spouse name: Not on file   Number of children: Not on file   Years of education: Not on file   Highest education level: Not on file  Occupational History   Not on file  Tobacco Use   Smoking status: Every Day    Types: Cigarettes   Smokeless tobacco: Never  Vaping Use   Vaping status: Never Used  Substance and Sexual Activity   Alcohol use: No   Drug use: No   Sexual activity: Not on file  Other Topics Concern   Not on file  Social History Narrative   Not on file   Social Drivers of Health   Financial Resource Strain: Not on file  Food Insecurity: No Food Insecurity (11/25/2023)   Hunger Vital Sign    Worried About Running Out of Food in the Last Year: Never true    Ran Out of Food in the Last Year: Never true  Transportation Needs: No Transportation Needs  (11/25/2023)   PRAPARE - Administrator, Civil Service (Medical): No    Lack of Transportation (Non-Medical): No  Physical Activity: Not on file  Stress: Not on file  Social Connections: Not on file  Intimate Partner Violence: Not At Risk (11/25/2023)   Humiliation, Afraid, Rape, and Kick questionnaire    Fear of Current or Ex-Partner: No    Emotionally Abused: No    Physically Abused: No    Sexually Abused: No    Family History  Problem Relation Age of Onset   Hypertension Mother    Hypertension Father    Diabetes Father     Allergies  Allergen Reactions   Benadryl [Diphenhydramine] Anaphylaxis   Citrullus Vulgaris Anaphylaxis   Coconut (Cocos Nucifera) Anaphylaxis   Flavoring Agent (Non-Screening) Anaphylaxis   Tomato Anaphylaxis   Other Swelling    Berries, watermelon, coconut    Outpatient Medications Prior to Visit  Medication Sig   acetaminophen  (TYLENOL ) 650 MG CR tablet Take 650 mg by mouth every 8 (eight) hours as needed for pain.   Blood Glucose Monitoring Suppl DEVI 1 each by Does not apply route 3 (three) times daily. May dispense any manufacturer covered by patient's insurance.   fluticasone  (FLONASE ) 50 MCG/ACT nasal  spray Place 2 sprays into both nostrils daily.   furosemide  (LASIX ) 20 MG tablet Take 1 tablet (20 mg total) by mouth daily as needed.   glucose blood (ACCU-CHEK AVIVA PLUS) test strip Use as instructed   insulin  aspart (NOVOLOG ) 100 UNIT/ML FlexPen Inject 0-11 Units into the skin 3 (three) times daily with meals. Check Blood Glucose (BG) and inject per scale: BG <150= 0 unit; BG 150-200= 1 unit; BG 201-250= 3 unit; BG 251-300= 5 unit; BG 301-350= 7 unit; BG 351-400= 9 unit; BG >400= 11 unit and Call Primary Care.   insulin  glargine (LANTUS ) 100 UNIT/ML injection Inject 0.2 mLs (20 Units total) into the skin daily.   Insulin  Pen Needle (PEN NEEDLES) 31G X 5 MM MISC 1 each by Does not apply route 3 (three) times daily. May dispense any  manufacturer covered by patient's insurance.   Lancet Device MISC 1 each by Does not apply route 3 (three) times daily. May dispense any manufacturer covered by patient's insurance.   Lancets MISC 1 each by Does not apply route 3 (three) times daily. Use as directed to check blood sugar. May dispense any manufacturer covered by patient's insurance and fits patient's device.   omeprazole  (PRILOSEC  OTC) 20 MG tablet Take 1 tablet (20 mg total) by mouth daily.   OZEMPIC , 0.25 OR 0.5 MG/DOSE, 2 MG/3ML SOPN Inject 0.25 mg into the skin once a week.   SUMAtriptan  (IMITREX ) 25 MG tablet Take 1 tablet (25 mg total) by mouth once as needed for migraine. May repeat in 2 hours if headache persists or recurs.   [DISCONTINUED] atorvastatin  (LIPITOR) 20 MG tablet Take 1 tablet (20 mg total) by mouth daily.   [DISCONTINUED] cetirizine  (ZYRTEC ) 10 MG tablet Take 1 tablet (10 mg total) by mouth daily.   [DISCONTINUED] losartan -hydrochlorothiazide (HYZAAR) 50-12.5 MG tablet Take 1 tablet by mouth daily.   [DISCONTINUED] metFORMIN  (GLUCOPHAGE ) 500 MG tablet Take 1 tablet (500 mg total) by mouth 2 (two) times daily with a meal.   [DISCONTINUED] ondansetron  (ZOFRAN -ODT) 4 MG disintegrating tablet Take 1 tablet (4 mg total) by mouth every 8 (eight) hours as needed for nausea or vomiting. (Patient not taking: Reported on 12/01/2023)   No facility-administered medications prior to visit.    Review of Systems  Constitutional: Negative.   HENT: Negative.    Eyes: Negative.   Respiratory: Negative.  Negative for shortness of breath.   Cardiovascular: Negative.  Negative for chest pain.  Gastrointestinal: Negative.  Negative for abdominal pain, constipation and diarrhea.  Genitourinary: Negative.   Musculoskeletal:  Negative for joint pain and myalgias.  Skin: Negative.   Neurological: Negative.  Negative for dizziness and headaches.  Endo/Heme/Allergies: Negative.   All other systems reviewed and are negative.       Objective:   BP 126/84   Pulse 89   Ht 5' 2 (1.575 m)   Wt 227 lb 9.6 oz (103.2 kg)   LMP 03/29/2020 (Approximate)   SpO2 94%   BMI 41.63 kg/m   Vitals:   12/01/23 0924  BP: 126/84  Pulse: 89  Height: 5' 2 (1.575 m)  Weight: 227 lb 9.6 oz (103.2 kg)  SpO2: 94%  BMI (Calculated): 41.62    Physical Exam Vitals and nursing note reviewed.  Constitutional:      Appearance: Normal appearance. She is normal weight.  HENT:     Head: Normocephalic and atraumatic.     Nose: Nose normal.     Mouth/Throat:     Mouth:  Mucous membranes are moist.  Eyes:     Extraocular Movements: Extraocular movements intact.     Conjunctiva/sclera: Conjunctivae normal.     Pupils: Pupils are equal, round, and reactive to light.  Cardiovascular:     Rate and Rhythm: Normal rate and regular rhythm.     Pulses: Normal pulses.     Heart sounds: Normal heart sounds.  Pulmonary:     Effort: Pulmonary effort is normal.     Breath sounds: Normal breath sounds.  Abdominal:     General: Abdomen is flat. Bowel sounds are normal.     Palpations: Abdomen is soft.  Musculoskeletal:        General: Normal range of motion.     Cervical back: Normal range of motion.  Skin:    General: Skin is warm and dry.  Neurological:     General: No focal deficit present.     Mental Status: She is alert and oriented to person, place, and time.  Psychiatric:        Mood and Affect: Mood normal.        Behavior: Behavior normal.        Thought Content: Thought content normal.        Judgment: Judgment normal.      Results for orders placed or performed in visit on 12/01/23  POCT Urine Albumin/Creatinine with ratio [[id number]]  Result Value Ref Range   Microalbumin Ur, POC 150 mg/L   Creatinine, POC 100 mg/dL   Albumin/Creatinine Ratio, Urine, POC 30-300     Recent Results (from the past 2160 hours)  CBC     Status: Abnormal   Collection Time: 10/02/23  8:20 AM  Result Value Ref Range   WBC 8.6 4.0 -  10.5 K/uL   RBC 4.08 3.87 - 5.11 MIL/uL   Hemoglobin 11.7 (L) 12.0 - 15.0 g/dL   HCT 63.6 63.9 - 53.9 %   MCV 89.0 80.0 - 100.0 fL   MCH 28.7 26.0 - 34.0 pg   MCHC 32.2 30.0 - 36.0 g/dL   RDW 84.4 88.4 - 84.4 %   Platelets 293 150 - 400 K/uL   nRBC 0.0 0.0 - 0.2 %    Comment: Performed at [facility], [address] [place], [place] [address]  Comprehensive metabolic panel     Status: Abnormal   Collection Time: 10/02/23  8:20 AM  Result Value Ref Range   Sodium 140 135 - 145 mmol/L   Potassium 3.7 3.5 - 5.1 mmol/L   Chloride 103 98 - 111 mmol/L   CO2 27 22 - 32 mmol/L   Glucose, Bld 137 (H) 70 - 99 mg/dL    Comment: Glucose reference range applies only to samples taken after fasting for at least 8 hours.   BUN 19 6 - 20 mg/dL   Creatinine, Ser 8.80 (H) 0.44 - 1.00 mg/dL   Calcium  9.1 8.9 - 10.3 mg/dL   Total Protein 7.0 6.5 - 8.1 g/dL   Albumin 3.7 3.5 - 5.0 g/dL   AST 24 15 - 41 U/L   ALT 29 0 - 44 U/L   Alkaline Phosphatase 74 38 - 126 U/L   Total Bilirubin 0.3 0.0 - 1.2 mg/dL   GFR, Estimated 54 (L) >60 mL/min    Comment: (NOTE) Calculated using the CKD-EPI Creatinine Equation (2021)    Anion gap 10 5 - 15    Comment: Performed at [facility], [address], [place], [place] [address]  Lipase,  blood     Status: Abnormal   Collection Time: 10/02/23  8:20 AM  Result Value Ref Range   Lipase 53 (H) 11 - 51 U/L    Comment: Performed at [facility], [address] [place], [place] [address]  Troponin I (High Sensitivity)     Status: None   Collection Time: 10/02/23  8:20 AM  Result Value Ref Range   Troponin I (High Sensitivity) 4 <18 ng/L    Comment: (NOTE) Elevated high sensitivity troponin I (hsTnI) values and significant  changes across serial measurements may suggest ACS but many other  chronic and acute conditions are known to elevate hsTnI results.  Refer to the Links section for chest pain algorithms and additional   guidance. Performed at [facility], [address] [place], [place] [address]   Magnesium      Status: None   Collection Time: 10/02/23  8:20 AM  Result Value Ref Range   Magnesium  1.7 1.7 - 2.4 mg/dL    Comment: Performed at [facility], [address] [place], [place] [address]  Troponin I (High Sensitivity)     Status: None   Collection Time: 10/02/23 10:15 AM  Result Value Ref Range   Troponin I (High Sensitivity) 7 <18 ng/L    Comment: (NOTE) Elevated high sensitivity troponin I (hsTnI) values and significant  changes across serial measurements may suggest ACS but many other  chronic and acute conditions are known to elevate hsTnI results.  Refer to the Links section for chest pain algorithms and additional  guidance. Performed at [facility], [address] [place], [place] [address]   Blood gas, venous     Status: Abnormal   Collection Time: 11/24/23  7:42 PM  Result Value Ref Range   pH, Ven 7.29 7.25 - 7.43   pCO2, Ven 42 (L) 44 - 60 mmHg   Bicarbonate 20.2 20.0 - 28.0 mmol/L   Acid-base deficit 6.1 (H) 0.0 - 2.0 mmol/L   O2 Saturation 40.5 %   Patient temperature 37.0    Collection site VEIN     Comment: Performed at [facility], [address] [place], [place] [address]  CBC with Differential     Status: None   Collection Time: 11/24/23  8:01 PM  Result Value Ref Range   WBC 8.8 4.0 - 10.5 K/uL   RBC 4.58 3.87 - 5.11 MIL/uL   Hemoglobin 13.2 12.0 - 15.0 g/dL   HCT 59.8 63.9 - 53.9 %   MCV 87.6 80.0 - 100.0 fL   MCH 28.8 26.0 - 34.0 pg   MCHC 32.9 30.0 - 36.0 g/dL   RDW 85.0 88.4 - 84.4 %   Platelets 348 150 - 400 K/uL   nRBC 0.0 0.0 - 0.2 %   Neutrophils Relative % 64 %   Neutro Abs 5.6 1.7 - 7.7 K/uL   Lymphocytes Relative 31 %   Lymphs Abs 2.7 0.7 - 4.0 K/uL   Monocytes Relative 5 %   Monocytes Absolute 0.5 0.1 - 1.0 K/uL   Eosinophils Relative 0 %   Eosinophils Absolute 0.0 0.0 - 0.5 K/uL    Basophils Relative 0 %   Basophils Absolute 0.0 0.0 - 0.1 K/uL   Immature Granulocytes 0 %   Abs Immature Granulocytes 0.03 0.00 - 0.07 K/uL    Comment: Performed at [facility], [address], [place], [place] [address]  Basic metabolic panel     Status: Abnormal   Collection  Time: 11/24/23  8:01 PM  Result Value Ref Range   Sodium 133 (L) 135 - 145 mmol/L    Comment: ELECTROLYTES REPEATED TO VERIFY MU   Potassium 4.8 3.5 - 5.1 mmol/L   Chloride 93 (L) 98 - 111 mmol/L   CO2 18 (L) 22 - 32 mmol/L   Glucose, Bld 516 (HH) 70 - 99 mg/dL    Comment: CRITICAL RESULT CALLED TO, READ BACK BY AND VERIFIED WITH ELLIE MANGAROO 11/24/23 2105 MU Glucose reference range applies only to samples taken after fasting for at least 8 hours.    BUN 33 (H) 6 - 20 mg/dL   Creatinine, Ser 8.35 (H) 0.44 - 1.00 mg/dL   Calcium  10.6 (H) 8.9 - 10.3 mg/dL   GFR, Estimated 37 (L) >60 mL/min    Comment: (NOTE) Calculated using the CKD-EPI Creatinine Equation (2021)    Anion gap 22 (H) 5 - 15    Comment: Performed at [facility], [address] [place], [place] [address]  Brain natriuretic peptide     Status: None   Collection Time: 11/24/23  8:01 PM  Result Value Ref Range   B Natriuretic Peptide 14.0 0.0 - 100.0 pg/mL    Comment: Performed at [facility], [address] [place], [place] [address]  Resp panel by RT-PCR (RSV, Flu A&B, Covid) Anterior Nasal Swab     Status: None   Collection Time: 11/24/23  8:01 PM   Specimen: Anterior Nasal Swab  Result Value Ref Range   SARS Coronavirus 2 by RT PCR NEGATIVE NEGATIVE    Comment: (NOTE) SARS-CoV-2 target nucleic acids are NOT DETECTED.  The SARS-CoV-2 RNA is generally detectable in upper respiratory specimens during the acute phase of infection. The lowest concentration of SARS-CoV-2 viral copies this assay can detect is 138 copies/mL. A negative result does not preclude SARS-Cov-2 infection and should not be used as  the sole basis for treatment or other patient management decisions. A negative result may occur with  improper specimen collection/handling, submission of specimen other than nasopharyngeal swab, presence of viral mutation(s) within the areas targeted by this assay, and inadequate number of viral copies(<138 copies/mL). A negative result must be combined with clinical observations, patient history, and epidemiological information. The expected result is Negative.  Fact Sheet for Patients:  BloggerCourse.com  Fact Sheet for Healthcare Providers:  SeriousBroker.it  This test is no t yet approved or cleared by the [place]  FDA and  has been authorized for detection and/or diagnosis of SARS-CoV-2 by FDA under an Emergency Use Authorization (EUA). This EUA will remain  in effect (meaning this test can be used) for the duration of the COVID-19 declaration under Section 564(b)(1) of the Act, 21 U.S.C.section 360bbb-3(b)(1), unless the authorization is terminated  or revoked sooner.       Influenza A by PCR NEGATIVE NEGATIVE   Influenza B by PCR NEGATIVE NEGATIVE    Comment: (NOTE) The Xpert Xpress SARS-CoV-2/FLU/RSV plus assay is intended as an aid in the diagnosis of influenza from Nasopharyngeal swab specimens and should not be used as a sole basis for treatment. Nasal washings and aspirates are unacceptable for Xpert Xpress SARS-CoV-2/FLU/RSV testing.  Fact Sheet for Patients: BloggerCourse.com  Fact Sheet for Healthcare Providers: SeriousBroker.it  This test is not yet approved or cleared by the [place]  FDA and has been authorized for detection and/or diagnosis of SARS-CoV-2 by FDA under an Emergency Use Authorization (EUA). This EUA will remain in effect (meaning this test can  be used) for the duration of the COVID-19 declaration under Section 564(b)(1) of the Act, 21  U.S.C. section 360bbb-3(b)(1), unless the authorization is terminated or revoked.     Resp Syncytial Virus by PCR NEGATIVE NEGATIVE    Comment: (NOTE) Fact Sheet for Patients: BloggerCourse.com  Fact Sheet for Healthcare Providers: SeriousBroker.it  This test is not yet approved or cleared by the [place]  FDA and has been authorized for detection and/or diagnosis of SARS-CoV-2 by FDA under an Emergency Use Authorization (EUA). This EUA will remain in effect (meaning this test can be used) for the duration of the COVID-19 declaration under Section 564(b)(1) of the Act, 21 U.S.C. section 360bbb-3(b)(1), unless the authorization is terminated or revoked.  Performed at [facility], [address] [place], [place] [address]   CBG monitoring, ED     Status: Abnormal   Collection Time: 11/24/23  8:05 PM  Result Value Ref Range   Glucose-Capillary 486 (H) 70 - 99 mg/dL    Comment: Glucose reference range applies only to samples taken after fasting for at least 8 hours.  Basic metabolic panel     Status: Abnormal   Collection Time: 11/24/23  9:35 PM  Result Value Ref Range   Sodium 133 (L) 135 - 145 mmol/L    Comment: ELECTROLYTES REPEATED TO VERIFY MU   Potassium 4.8 3.5 - 5.1 mmol/L   Chloride 97 (L) 98 - 111 mmol/L   CO2 16 (L) 22 - 32 mmol/L   Glucose, Bld 462 (H) 70 - 99 mg/dL    Comment: Glucose reference range applies only to samples taken after fasting for at least 8 hours.   BUN 30 (H) 6 - 20 mg/dL   Creatinine, Ser 8.57 (H) 0.44 - 1.00 mg/dL   Calcium  9.9 8.9 - 10.3 mg/dL   GFR, Estimated 44 (L) >60 mL/min    Comment: (NOTE) Calculated using the CKD-EPI Creatinine Equation (2021)    Anion gap 20 (H) 5 - 15    Comment: Performed at [facility], [address] [place], [place] [address]  Beta-hydroxybutyric acid     Status: Abnormal   Collection Time: 11/24/23  9:35 PM  Result Value Ref  Range   Beta-Hydroxybutyric Acid 6.51 (H) 0.05 - 0.27 mmol/L    Comment: RESULT CONFIRMED BY MANUAL DILUTION ASW Performed at [facility], [address] [place], [place] [address]   CBG monitoring, ED     Status: Abnormal   Collection Time: 11/24/23  9:48 PM  Result Value Ref Range   Glucose-Capillary 414 (H) 70 - 99 mg/dL    Comment: Glucose reference range applies only to samples taken after fasting for at least 8 hours.  CBG monitoring, ED     Status: Abnormal   Collection Time: 11/24/23 10:18 PM  Result Value Ref Range   Glucose-Capillary 441 (H) 70 - 99 mg/dL    Comment: Glucose reference range applies only to samples taken after fasting for at least 8 hours.  CBG monitoring, ED     Status: Abnormal   Collection Time: 11/24/23 10:20 PM  Result Value Ref Range   Glucose-Capillary 466 (H) 70 - 99 mg/dL    Comment: Glucose reference range applies only to samples taken after fasting for at least 8 hours.  CBG monitoring, ED     Status: Abnormal   Collection Time: 11/24/23 10:48 PM  Result Value Ref Range   Glucose-Capillary 414 (H) 70 - 99 mg/dL    Comment: Glucose  reference range applies only to samples taken after fasting for at least 8 hours.  MRSA Next Gen by PCR, Nasal     Status: None   Collection Time: 11/24/23 11:02 PM   Specimen: Nasal Mucosa; Nasal Swab  Result Value Ref Range   MRSA by PCR Next Gen NOT DETECTED NOT DETECTED    Comment: (NOTE) The GeneXpert MRSA Assay (FDA approved for NASAL specimens only), is one component of a comprehensive MRSA colonization surveillance program. It is not intended to diagnose MRSA infection nor to guide or monitor treatment for MRSA infections. Test performance is not FDA approved in patients less than 56 years old. Performed at [facility], [address] [place], [place] [address]   Glucose, capillary     Status: Abnormal   Collection Time: 11/24/23 11:22 PM  Result Value Ref Range    Glucose-Capillary 380 (H) 70 - 99 mg/dL    Comment: Glucose reference range applies only to samples taken after fasting for at least 8 hours.  Urinalysis, Routine w reflex microscopic -Urine, Clean Catch     Status: Abnormal   Collection Time: 11/24/23 11:23 PM  Result Value Ref Range   Color, Urine YELLOW (A) YELLOW   APPearance HAZY (A) CLEAR   Specific Gravity, Urine 1.032 (H) 1.005 - 1.030   pH 5.0 5.0 - 8.0   Glucose, UA >=500 (A) NEGATIVE mg/dL   Hgb urine dipstick MODERATE (A) NEGATIVE   Bilirubin Urine NEGATIVE NEGATIVE   Ketones, ur 80 (A) NEGATIVE mg/dL   Protein, ur 30 (A) NEGATIVE mg/dL   Nitrite NEGATIVE NEGATIVE   Leukocytes,Ua MODERATE (A) NEGATIVE   RBC / HPF 21-50 0 - 5 RBC/hpf   WBC, UA 21-50 0 - 5 WBC/hpf   Bacteria, UA NONE SEEN NONE SEEN   Squamous Epithelial / HPF 0-5 0 - 5 /HPF   Mucus PRESENT     Comment: Performed at [facility], [address] [place], [place] [address]  Glucose, capillary     Status: Abnormal   Collection Time: 11/25/23 12:17 AM  Result Value Ref Range   Glucose-Capillary 335 (H) 70 - 99 mg/dL    Comment: Glucose reference range applies only to samples taken after fasting for at least 8 hours.  Glucose, capillary     Status: Abnormal   Collection Time: 11/25/23  1:27 AM  Result Value Ref Range   Glucose-Capillary 218 (H) 70 - 99 mg/dL    Comment: Glucose reference range applies only to samples taken after fasting for at least 8 hours.  Basic metabolic panel     Status: Abnormal   Collection Time: 11/25/23  1:50 AM  Result Value Ref Range   Sodium 141 135 - 145 mmol/L   Potassium 4.5 3.5 - 5.1 mmol/L   Chloride 102 98 - 111 mmol/L   CO2 21 (L) 22 - 32 mmol/L   Glucose, Bld 189 (H) 70 - 99 mg/dL    Comment: Glucose reference range applies only to samples taken after fasting for at least 8 hours.   BUN 29 (H) 6 - 20 mg/dL   Creatinine, Ser 8.67 (H) 0.44 - 1.00 mg/dL   Calcium  10.4 (H) 8.9 - 10.3 mg/dL   GFR, Estimated 48  (L) >60 mL/min    Comment: (NOTE) Calculated using the CKD-EPI Creatinine Equation (2021)    Anion gap 18 (H) 5 - 15    Comment: Performed at [facility], [address], [place], [place] [address]  Beta-hydroxybutyric acid     Status: Abnormal   Collection Time: 11/25/23  1:50 AM  Result Value Ref Range   Beta-Hydroxybutyric Acid 2.59 (H) 0.05 - 0.27 mmol/L    Comment: Performed at [facility], [address] [place], [place] [address]  Glucose, capillary     Status: Abnormal   Collection Time: 11/25/23  2:30 AM  Result Value Ref Range   Glucose-Capillary 195 (H) 70 - 99 mg/dL    Comment: Glucose reference range applies only to samples taken after fasting for at least 8 hours.  Glucose, capillary     Status: Abnormal   Collection Time: 11/25/23  3:31 AM  Result Value Ref Range   Glucose-Capillary 212 (H) 70 - 99 mg/dL    Comment: Glucose reference range applies only to samples taken after fasting for at least 8 hours.  Glucose, capillary     Status: Abnormal   Collection Time: 11/25/23  4:30 AM  Result Value Ref Range   Glucose-Capillary 196 (H) 70 - 99 mg/dL    Comment: Glucose reference range applies only to samples taken after fasting for at least 8 hours.  Basic metabolic panel     Status: Abnormal   Collection Time: 11/25/23  4:36 AM  Result Value Ref Range   Sodium 139 135 - 145 mmol/L   Potassium 4.3 3.5 - 5.1 mmol/L   Chloride 102 98 - 111 mmol/L   CO2 21 (L) 22 - 32 mmol/L   Glucose, Bld 197 (H) 70 - 99 mg/dL    Comment: Glucose reference range applies only to samples taken after fasting for at least 8 hours.   BUN 27 (H) 6 - 20 mg/dL   Creatinine, Ser 8.86 (H) 0.44 - 1.00 mg/dL   Calcium  10.1 8.9 - 10.3 mg/dL   GFR, Estimated 57 (L) >60 mL/min    Comment: (NOTE) Calculated using the CKD-EPI Creatinine Equation (2021)    Anion gap 16 (H) 5 - 15    Comment: Performed at [facility], [address] [place], [place] [address]   Beta-hydroxybutyric acid     Status: Abnormal   Collection Time: 11/25/23  4:36 AM  Result Value Ref Range   Beta-Hydroxybutyric Acid 1.90 (H) 0.05 - 0.27 mmol/L    Comment: Performed at [facility], [address] [place], [place] [address]  HIV Antibody (routine testing w rflx)     Status: None   Collection Time: 11/25/23  4:36 AM  Result Value Ref Range   HIV Screen 4th Generation wRfx Non Reactive Non Reactive    Comment: Performed at [facility], [address] [place], [place] [address]  Glucose, capillary     Status: Abnormal   Collection Time: 11/25/23  5:35 AM  Result Value Ref Range   Glucose-Capillary 221 (H) 70 - 99 mg/dL    Comment: Glucose reference range applies only to samples taken after fasting for at least 8 hours.  Glucose, capillary     Status: Abnormal   Collection Time: 11/25/23  6:32 AM  Result Value Ref Range   Glucose-Capillary 193 (H) 70 - 99 mg/dL    Comment: Glucose reference range applies only to samples taken after fasting for at least 8 hours.  Glucose, capillary     Status: Abnormal   Collection Time: 11/25/23  7:25 AM  Result Value Ref Range   Glucose-Capillary 186 (H) 70 - 99 mg/dL    Comment: Glucose reference range applies only to samples  taken after fasting for at least 8 hours.  Glucose, capillary     Status: Abnormal   Collection Time: 11/25/23  8:26 AM  Result Value Ref Range   Glucose-Capillary 189 (H) 70 - 99 mg/dL    Comment: Glucose reference range applies only to samples taken after fasting for at least 8 hours.  Glucose, capillary     Status: Abnormal   Collection Time: 11/25/23  9:29 AM  Result Value Ref Range   Glucose-Capillary 157 (H) 70 - 99 mg/dL    Comment: Glucose reference range applies only to samples taken after fasting for at least 8 hours.  Basic metabolic panel     Status: Abnormal   Collection Time: 11/25/23 10:01 AM  Result Value Ref Range   Sodium 137 135 - 145 mmol/L   Potassium 3.5 3.5 - 5.1  mmol/L   Chloride 103 98 - 111 mmol/L   CO2 23 22 - 32 mmol/L   Glucose, Bld 162 (H) 70 - 99 mg/dL    Comment: Glucose reference range applies only to samples taken after fasting for at least 8 hours.   BUN 24 (H) 6 - 20 mg/dL   Creatinine, Ser 8.94 (H) 0.44 - 1.00 mg/dL   Calcium  10.0 8.9 - 10.3 mg/dL   GFR, Estimated >39 >39 mL/min    Comment: (NOTE) Calculated using the CKD-EPI Creatinine Equation (2021)    Anion gap 11 5 - 15    Comment: Performed at [facility], [address] [place], [place] [address]  Beta-hydroxybutyric acid     Status: Abnormal   Collection Time: 11/25/23 10:01 AM  Result Value Ref Range   Beta-Hydroxybutyric Acid 0.34 (H) 0.05 - 0.27 mmol/L    Comment: Performed at [facility], [address] [place], [place] [address]  Magnesium      Status: None   Collection Time: 11/25/23 10:01 AM  Result Value Ref Range   Magnesium  2.0 1.7 - 2.4 mg/dL    Comment: Performed at [facility], [address] [place], [place] [address]  Phosphorus     Status: None   Collection Time: 11/25/23 10:01 AM  Result Value Ref Range   Phosphorus 2.5 2.5 - 4.6 mg/dL    Comment: Performed at [facility], [address] [place], [place] [address]  Glucose, capillary     Status: Abnormal   Collection Time: 11/25/23 10:27 AM  Result Value Ref Range   Glucose-Capillary 184 (H) 70 - 99 mg/dL    Comment: Glucose reference range applies only to samples taken after fasting for at least 8 hours.  Glucose, capillary     Status: Abnormal   Collection Time: 11/25/23 11:25 AM  Result Value Ref Range   Glucose-Capillary 194 (H) 70 - 99 mg/dL    Comment: Glucose reference range applies only to samples taken after fasting for at least 8 hours.  Glucose, capillary     Status: Abnormal   Collection Time: 11/25/23 12:38 PM  Result Value Ref Range   Glucose-Capillary 166 (H) 70 - 99 mg/dL    Comment: Glucose reference range applies only to samples  taken after fasting for at least 8 hours.  Basic metabolic panel     Status: Abnormal   Collection Time: 11/25/23  1:49 PM  Result Value Ref Range   Sodium 136 135 - 145 mmol/L   Potassium 3.6 3.5 - 5.1 mmol/L   Chloride 104 98 - 111 mmol/L   CO2 23 22 - 32  mmol/L   Glucose, Bld 173 (H) 70 - 99 mg/dL    Comment: Glucose reference range applies only to samples taken after fasting for at least 8 hours.   BUN 22 (H) 6 - 20 mg/dL   Creatinine, Ser 9.00 0.44 - 1.00 mg/dL   Calcium  9.6 8.9 - 10.3 mg/dL   GFR, Estimated >39 >39 mL/min    Comment: (NOTE) Calculated using the CKD-EPI Creatinine Equation (2021)    Anion gap 9 5 - 15    Comment: Performed at [facility], [address] [place], [place] [address]  Beta-hydroxybutyric acid     Status: Abnormal   Collection Time: 11/25/23  1:49 PM  Result Value Ref Range   Beta-Hydroxybutyric Acid 0.36 (H) 0.05 - 0.27 mmol/L    Comment: Performed at [facility], [address] [place], [place] [address]  Glucose, capillary     Status: Abnormal   Collection Time: 11/25/23  1:55 PM  Result Value Ref Range   Glucose-Capillary 184 (H) 70 - 99 mg/dL    Comment: Glucose reference range applies only to samples taken after fasting for at least 8 hours.  Glucose, capillary     Status: Abnormal   Collection Time: 11/25/23  4:30 PM  Result Value Ref Range   Glucose-Capillary 304 (H) 70 - 99 mg/dL    Comment: Glucose reference range applies only to samples taken after fasting for at least 8 hours.  Glucose, capillary     Status: Abnormal   Collection Time: 11/25/23  9:41 PM  Result Value Ref Range   Glucose-Capillary 336 (H) 70 - 99 mg/dL    Comment: Glucose reference range applies only to samples taken after fasting for at least 8 hours.  Glucose, capillary     Status: Abnormal   Collection Time: 11/25/23 11:52 PM  Result Value Ref Range   Glucose-Capillary 340 (H) 70 - 99 mg/dL    Comment: Glucose reference range applies  only to samples taken after fasting for at least 8 hours.  Basic metabolic panel with GFR     Status: Abnormal   Collection Time: 11/26/23  4:53 AM  Result Value Ref Range   Sodium 135 135 - 145 mmol/L   Potassium 3.9 3.5 - 5.1 mmol/L   Chloride 105 98 - 111 mmol/L   CO2 21 (L) 22 - 32 mmol/L   Glucose, Bld 252 (H) 70 - 99 mg/dL    Comment: Glucose reference range applies only to samples taken after fasting for at least 8 hours.   BUN 22 (H) 6 - 20 mg/dL   Creatinine, Ser 9.21 0.44 - 1.00 mg/dL   Calcium  9.3 8.9 - 10.3 mg/dL   GFR, Estimated >39 >39 mL/min    Comment: (NOTE) Calculated using the CKD-EPI Creatinine Equation (2021)    Anion gap 9 5 - 15    Comment: Performed at [facility], [address] [place], [place] [address]  CBC     Status: Abnormal   Collection Time: 11/26/23  4:53 AM  Result Value Ref Range   WBC 6.8 4.0 - 10.5 K/uL   RBC 3.99 3.87 - 5.11 MIL/uL   Hemoglobin 11.3 (L) 12.0 - 15.0 g/dL   HCT 65.1 (L) 63.9 - 53.9 %   MCV 87.2 80.0 - 100.0 fL   MCH 28.3 26.0 - 34.0 pg   MCHC 32.5 30.0 - 36.0 g/dL   RDW 84.9 88.4 - 84.4 %   Platelets 291 150 - 400 K/uL  nRBC 0.0 0.0 - 0.2 %    Comment: Performed at [facility], [address] [place], [place] [address]  Magnesium      Status: None   Collection Time: 11/26/23  4:53 AM  Result Value Ref Range   Magnesium  1.8 1.7 - 2.4 mg/dL    Comment: Performed at [facility], [address] [place], [place] [address]  Phosphorus     Status: Abnormal   Collection Time: 11/26/23  4:53 AM  Result Value Ref Range   Phosphorus 2.0 (L) 2.5 - 4.6 mg/dL    Comment: Performed at [facility], [address] [place], [place] [address]  Hepatic function panel     Status: Abnormal   Collection Time: 11/26/23  4:53 AM  Result Value Ref Range   Total Protein 6.7 6.5 - 8.1 g/dL   Albumin 3.0 (L) 3.5 - 5.0 g/dL   AST 32 15 - 41 U/L   ALT 22 0 - 44 U/L   Alkaline Phosphatase 79 38 -  126 U/L   Total Bilirubin 0.5 0.0 - 1.2 mg/dL   Bilirubin, Direct <9.8 0.0 - 0.2 mg/dL   Indirect Bilirubin NOT CALCULATED 0.3 - 0.9 mg/dL    Comment: Performed at [facility], [address] [place], [place] [address]  Glucose, capillary     Status: Abnormal   Collection Time: 11/26/23  7:48 AM  Result Value Ref Range   Glucose-Capillary 332 (H) 70 - 99 mg/dL    Comment: Glucose reference range applies only to samples taken after fasting for at least 8 hours.  Glucose, capillary     Status: Abnormal   Collection Time: 11/26/23 12:08 PM  Result Value Ref Range   Glucose-Capillary 318 (H) 70 - 99 mg/dL    Comment: Glucose reference range applies only to samples taken after fasting for at least 8 hours.  Glucose, capillary     Status: Abnormal   Collection Time: 11/26/23  4:45 PM  Result Value Ref Range   Glucose-Capillary 307 (H) 70 - 99 mg/dL    Comment: Glucose reference range applies only to samples taken after fasting for at least 8 hours.  Glucose, capillary     Status: Abnormal   Collection Time: 11/26/23  7:54 PM  Result Value Ref Range   Glucose-Capillary 371 (H) 70 - 99 mg/dL    Comment: Glucose reference range applies only to samples taken after fasting for at least 8 hours.  Basic metabolic panel with GFR     Status: Abnormal   Collection Time: 11/27/23  4:47 AM  Result Value Ref Range   Sodium 138 135 - 145 mmol/L   Potassium 3.4 (L) 3.5 - 5.1 mmol/L   Chloride 106 98 - 111 mmol/L   CO2 22 22 - 32 mmol/L   Glucose, Bld 162 (H) 70 - 99 mg/dL    Comment: Glucose reference range applies only to samples taken after fasting for at least 8 hours.   BUN 16 6 - 20 mg/dL   Creatinine, Ser 9.41 0.44 - 1.00 mg/dL   Calcium  8.7 (L) 8.9 - 10.3 mg/dL   GFR, Estimated >39 >39 mL/min    Comment: (NOTE) Calculated using the CKD-EPI Creatinine Equation (2021)    Anion gap 10 5 - 15    Comment: Performed at [facility], [address], [place], [place]  [address]  CBC     Status: Abnormal   Collection Time: 11/27/23  4:47 AM  Result Value  Ref Range   WBC 6.8 4.0 - 10.5 K/uL   RBC 3.87 3.87 - 5.11 MIL/uL   Hemoglobin 10.9 (L) 12.0 - 15.0 g/dL   HCT 65.7 (L) 63.9 - 53.9 %   MCV 88.4 80.0 - 100.0 fL   MCH 28.2 26.0 - 34.0 pg   MCHC 31.9 30.0 - 36.0 g/dL   RDW 84.7 88.4 - 84.4 %   Platelets 257 150 - 400 K/uL   nRBC 0.0 0.0 - 0.2 %    Comment: Performed at [facility], [address] [place], [place] [address]  Magnesium      Status: None   Collection Time: 11/27/23  4:47 AM  Result Value Ref Range   Magnesium  1.9 1.7 - 2.4 mg/dL    Comment: Performed at [facility], [address], [place], [place] [address]  Phosphorus     Status: None   Collection Time: 11/27/23  4:47 AM  Result Value Ref Range   Phosphorus 3.2 2.5 - 4.6 mg/dL    Comment: Performed at [facility], [address] [place], [place] [address]  Hepatic function panel     Status: Abnormal   Collection Time: 11/27/23  4:47 AM  Result Value Ref Range   Total Protein 6.7 6.5 - 8.1 g/dL   Albumin 3.0 (L) 3.5 - 5.0 g/dL   AST 25 15 - 41 U/L   ALT 20 0 - 44 U/L   Alkaline Phosphatase 75 38 - 126 U/L   Total Bilirubin 0.6 0.0 - 1.2 mg/dL   Bilirubin, Direct <9.8 0.0 - 0.2 mg/dL   Indirect Bilirubin NOT CALCULATED 0.3 - 0.9 mg/dL    Comment: Performed at [facility], [address] [place], [place] [address]  Hemoglobin A1c     Status: Abnormal   Collection Time: 11/27/23  4:47 AM  Result Value Ref Range   Hgb A1c MFr Bld 12.7 (H) 4.8 - 5.6 %    Comment: (NOTE)         Prediabetes: 5.7 - 6.4         Diabetes: >6.4         Glycemic control for adults with diabetes: <7.0    Mean Plasma Glucose 318 mg/dL    Comment: (NOTE) Performed At: [facility] [address] [place], [place] [id number] Jennette Shorter MD Ey:[phone]   Glucose, capillary     Status: Abnormal   Collection Time: 11/27/23  7:28 AM  Result Value  Ref Range   Glucose-Capillary 162 (H) 70 - 99 mg/dL    Comment: Glucose reference range applies only to samples taken after fasting for at least 8 hours.  Glucose, capillary     Status: Abnormal   Collection Time: 11/27/23  1:02 PM  Result Value Ref Range   Glucose-Capillary 233 (H) 70 - 99 mg/dL    Comment: Glucose reference range applies only to samples taken after fasting for at least 8 hours.  POCT Urine Albumin/Creatinine with ratio [[id number]]     Status: Abnormal   Collection Time: 12/01/23 10:13 AM  Result Value Ref Range   Microalbumin Ur, POC 150 mg/L   Creatinine, POC 100 mg/dL   Albumin/Creatinine Ratio, Urine, POC 30-300       Assessment & Plan:  Take medications as prescribed. Schedule mammogram. Order for Cologuard sent.  Problem List Items Addressed This Visit       Endocrine   Diabetes mellitus without complication (HCC)   Relevant Medications   atorvastatin  (LIPITOR)  20 MG tablet   losartan -hydrochlorothiazide (HYZAAR) 50-12.5 MG tablet   metFORMIN  (GLUCOPHAGE ) 500 MG tablet   Other Relevant Orders   POCT Urine Albumin/Creatinine with ratio [[id number]] (Completed)   Other Visit Diagnoses       Colon cancer screening    -  Primary   Relevant Orders   Cologuard       Return in about 4 weeks (around 12/29/2023).   Total time spent: 25 minutes  [employer], NP  12/01/2023   This document may have been prepared by Dragon Voice Recognition software and as such may include unintentional dictation errors.

## 2023-12-30 ENCOUNTER — Other Ambulatory Visit: Payer: Self-pay | Admitting: Physician Assistant

## 2023-12-30 ENCOUNTER — Encounter: Payer: Self-pay | Admitting: Cardiology

## 2023-12-30 ENCOUNTER — Ambulatory Visit (INDEPENDENT_AMBULATORY_CARE_PROVIDER_SITE_OTHER): Admitting: Cardiology

## 2023-12-30 VITALS — BP 108/68 | HR 86 | Ht 62.0 in | Wt 217.0 lb

## 2023-12-30 DIAGNOSIS — D259 Leiomyoma of uterus, unspecified: Secondary | ICD-10-CM

## 2023-12-30 DIAGNOSIS — E119 Type 2 diabetes mellitus without complications: Secondary | ICD-10-CM | POA: Diagnosis not present

## 2023-12-30 DIAGNOSIS — R519 Headache, unspecified: Secondary | ICD-10-CM

## 2023-12-30 DIAGNOSIS — K769 Liver disease, unspecified: Secondary | ICD-10-CM

## 2023-12-30 DIAGNOSIS — I10 Essential (primary) hypertension: Secondary | ICD-10-CM

## 2023-12-30 DIAGNOSIS — R42 Dizziness and giddiness: Secondary | ICD-10-CM

## 2023-12-30 DIAGNOSIS — E782 Mixed hyperlipidemia: Secondary | ICD-10-CM

## 2023-12-30 DIAGNOSIS — H9313 Tinnitus, bilateral: Secondary | ICD-10-CM

## 2023-12-30 DIAGNOSIS — E278 Other specified disorders of adrenal gland: Secondary | ICD-10-CM

## 2023-12-30 DIAGNOSIS — H538 Other visual disturbances: Secondary | ICD-10-CM

## 2023-12-30 MED ORDER — PEN NEEDLES 31G X 5 MM MISC: 1.0000 | Freq: Three times a day (TID) | 3 refills | Status: DC

## 2023-12-30 MED ORDER — ALCOHOL PREP 70 % PADS: MEDICATED_PAD | 4 refills | Status: AC

## 2023-12-30 NOTE — Progress Notes (Signed)
 Established Patient Office Visit  Subjective:  Patient ID: Vanessa Wyatt, female    DOB: 1965-09-15  Age: 58 y.o. MRN: [id number]  Chief Complaint  Patient presents with   Follow-up    1 Month Follow Up    Patient in office for 1 month follow  up, hospital follow up. Patient doing well, no complaints today.  Patient went to ED on 11/24/23 with complaints of shortness of breath. Patient was found to be in DKA. Hgb A1c on 11/27/23 was 12.7, on 08/04/23 it was 7.7. Patient reports she was compliant with her medications. Patient started on Lantus  and NovoLog  sliding scale. Continued home meds. Recommended follow up with endocrinology, will send a referral.  Needs referral to GYN for uterine fibroids. Has not done Cologuard, has the box at home, will get done.  Per hospital discharge summary , patient needs MRI Liver and Left adrenal lesions. Will place order today.  Call to schedule mammogram.    No other concerns at this time.   Past Medical History:  Diagnosis Date   Arthritis    bilateral legs   Diabetes (HCC)    Hyperlipidemia    Hypertension     No past surgical history on file.  Social History   Socioeconomic History   Marital status: Divorced    Spouse name: Not on file   Number of children: Not on file   Years of education: Not on file   Highest education level: Not on file  Occupational History   Not on file  Tobacco Use   Smoking status: Every Day    Types: Cigarettes   Smokeless tobacco: Never  Vaping Use   Vaping status: Never Used  Substance and Sexual Activity   Alcohol use: No   Drug use: No   Sexual activity: Not on file  Other Topics Concern   Not on file  Social History Narrative   Not on file   Social Drivers of Health   Financial Resource Strain: Medium Risk (12/29/2023)   Received from [facility]   Overall Financial Resource Strain (CARDIA)    Difficulty of Paying Living Expenses: Somewhat hard  Food Insecurity: Food  Insecurity Present (12/29/2023)   Received from [facility]   Hunger Vital Sign    Within the past 12 months, you worried that your food would run out before you got the money to buy more.: Sometimes true    Within the past 12 months, the food you bought just didn't last and you didn't have money to get more.: Sometimes true  Transportation Needs: No Transportation Needs (12/29/2023)   Received from [facility] - Transportation    In the past 12 months, has lack of transportation kept you from medical appointments or from getting medications?: No    Lack of Transportation (Non-Medical): No  Physical Activity: Not on file  Stress: Not on file  Social Connections: Not on file  Intimate Partner Violence: Not At Risk (11/25/2023)   Humiliation, Afraid, Rape, and Kick questionnaire    Fear of Current or Ex-Partner: No    Emotionally Abused: No    Physically Abused: No    Sexually Abused: No    Family History  Problem Relation Age of Onset   Hypertension Mother    Hypertension Father    Diabetes Father     Allergies  Allergen Reactions   Benadryl [Diphenhydramine] Anaphylaxis   Citrullus Vulgaris Anaphylaxis   Coconut (Cocos  Nucifera) Anaphylaxis   Flavoring Agent (Non-Screening) Anaphylaxis   Tomato Anaphylaxis   Other Swelling    Berries, watermelon, coconut    Outpatient Medications Prior to Visit  Medication Sig   acetaminophen  (TYLENOL ) 650 MG CR tablet Take 650 mg by mouth every 8 (eight) hours as needed for pain.   atorvastatin  (LIPITOR) 20 MG tablet Take 1 tablet (20 mg total) by mouth daily.   butalbital-acetaminophen -caffeine (FIORICET) 50-325-40 MG tablet Take 1 tablet by mouth.   cetirizine  (ZYRTEC ) 10 MG tablet Take 1 tablet (10 mg total) by mouth daily.   fluticasone  (FLONASE ) 50 MCG/ACT nasal spray Place 2 sprays into both nostrils daily.   furosemide  (LASIX ) 20 MG tablet Take 1 tablet (20 mg total) by mouth daily as  needed.   glucose blood (ACCU-CHEK AVIVA PLUS) test strip Use as instructed   insulin  aspart (NOVOLOG ) 100 UNIT/ML FlexPen Inject 0-11 Units into the skin 3 (three) times daily with meals. Check Blood Glucose (BG) and inject per scale: BG <150= 0 unit; BG 150-200= 1 unit; BG 201-250= 3 unit; BG 251-300= 5 unit; BG 301-350= 7 unit; BG 351-400= 9 unit; BG >400= 11 unit and Call Primary Care.   insulin  glargine (LANTUS ) 100 UNIT/ML injection Inject 0.2 mLs (20 Units total) into the skin daily.   losartan -hydrochlorothiazide (HYZAAR) 50-12.5 MG tablet Take 1 tablet by mouth daily.   metFORMIN  (GLUCOPHAGE ) 500 MG tablet Take 1 tablet (500 mg total) by mouth 2 (two) times daily with a meal.   omeprazole  (PRILOSEC  OTC) 20 MG tablet Take 1 tablet (20 mg total) by mouth daily.   OZEMPIC , 0.25 OR 0.5 MG/DOSE, 2 MG/3ML SOPN Inject 0.25 mg into the skin once a week.   propranolol (INDERAL) 20 MG tablet Take 1 tablet at headache onset, can repeat after 4 hours. No more than 2 pills in 24 hours. Do not take more than 2-3 times a week MAXIMUM.   SUMAtriptan  (IMITREX ) 25 MG tablet Take 1 tablet (25 mg total) by mouth once as needed for migraine. May repeat in 2 hours if headache persists or recurs.   Blood Glucose Monitoring Suppl DEVI 1 each by Does not apply route 3 (three) times daily. May dispense any manufacturer covered by patient's insurance.   Lancet Device MISC 1 each by Does not apply route 3 (three) times daily. May dispense any manufacturer covered by patient's insurance.   [DISCONTINUED] Insulin  Pen Needle (PEN NEEDLES) 31G X 5 MM MISC 1 each by Does not apply route 3 (three) times daily. May dispense any manufacturer covered by patient's insurance.   [DISCONTINUED] Lancets MISC 1 each by Does not apply route 3 (three) times daily. Use as directed to check blood sugar. May dispense any manufacturer covered by patient's insurance and fits patient's device.   No facility-administered medications prior to  visit.    Review of Systems  Constitutional: Negative.   HENT: Negative.    Eyes: Negative.   Respiratory: Negative.  Negative for shortness of breath.   Cardiovascular: Negative.  Negative for chest pain.  Gastrointestinal: Negative.  Negative for abdominal pain, constipation and diarrhea.  Genitourinary: Negative.   Musculoskeletal:  Negative for joint pain and myalgias.  Skin: Negative.   Neurological: Negative.  Negative for dizziness and headaches.  Endo/Heme/Allergies: Negative.   All other systems reviewed and are negative.      Objective:   BP 108/68   Pulse 86   Ht 5' 2 (1.575 m)   Wt 217 lb (98.4 kg)  LMP 03/29/2020 (Approximate)   SpO2 98%   BMI 39.69 kg/m   Vitals:   12/30/23 0939  BP: 108/68  Pulse: 86  Height: 5' 2 (1.575 m)  Weight: 217 lb (98.4 kg)  SpO2: 98%  BMI (Calculated): 39.68    Physical Exam Vitals and nursing note reviewed.  Constitutional:      Appearance: Normal appearance. She is normal weight.  HENT:     Head: Normocephalic and atraumatic.     Nose: Nose normal.     Mouth/Throat:     Mouth: Mucous membranes are moist.  Eyes:     Extraocular Movements: Extraocular movements intact.     Conjunctiva/sclera: Conjunctivae normal.     Pupils: Pupils are equal, round, and reactive to light.  Cardiovascular:     Rate and Rhythm: Normal rate and regular rhythm.     Pulses: Normal pulses.     Heart sounds: Normal heart sounds.  Pulmonary:     Effort: Pulmonary effort is normal.     Breath sounds: Normal breath sounds.  Abdominal:     General: Abdomen is flat. Bowel sounds are normal.     Palpations: Abdomen is soft.  Musculoskeletal:        General: Normal range of motion.     Cervical back: Normal range of motion.  Skin:    General: Skin is warm and dry.  Neurological:     General: No focal deficit present.     Mental Status: She is alert and oriented to person, place, and time.  Psychiatric:        Mood and Affect: Mood  normal.        Behavior: Behavior normal.        Thought Content: Thought content normal.        Judgment: Judgment normal.      No results found for any visits on 12/30/23.  Recent Results (from the past 2160 hours)  CBC     Status: Abnormal   Collection Time: 10/02/23  8:20 AM  Result Value Ref Range   WBC 8.6 4.0 - 10.5 K/uL   RBC 4.08 3.87 - 5.11 MIL/uL   Hemoglobin 11.7 (L) 12.0 - 15.0 g/dL   HCT 63.6 63.9 - 53.9 %   MCV 89.0 80.0 - 100.0 fL   MCH 28.7 26.0 - 34.0 pg   MCHC 32.2 30.0 - 36.0 g/dL   RDW 84.4 88.4 - 84.4 %   Platelets 293 150 - 400 K/uL   nRBC 0.0 0.0 - 0.2 %    Comment: Performed at [facility], [address] [place], [place] [address]  Comprehensive metabolic panel     Status: Abnormal   Collection Time: 10/02/23  8:20 AM  Result Value Ref Range   Sodium 140 135 - 145 mmol/L   Potassium 3.7 3.5 - 5.1 mmol/L   Chloride 103 98 - 111 mmol/L   CO2 27 22 - 32 mmol/L   Glucose, Bld 137 (H) 70 - 99 mg/dL    Comment: Glucose reference range applies only to samples taken after fasting for at least 8 hours.   BUN 19 6 - 20 mg/dL   Creatinine, Ser 8.80 (H) 0.44 - 1.00 mg/dL   Calcium  9.1 8.9 - 10.3 mg/dL   Total Protein 7.0 6.5 - 8.1 g/dL   Albumin 3.7 3.5 - 5.0 g/dL   AST 24 15 - 41 U/L   ALT 29 0 - 44 U/L   Alkaline Phosphatase 74 38 -  126 U/L   Total Bilirubin 0.3 0.0 - 1.2 mg/dL   GFR, Estimated 54 (L) >60 mL/min    Comment: (NOTE) Calculated using the CKD-EPI Creatinine Equation (2021)    Anion gap 10 5 - 15    Comment: Performed at [facility], [address] [place], [place] [address]  Lipase, blood     Status: Abnormal   Collection Time: 10/02/23  8:20 AM  Result Value Ref Range   Lipase 53 (H) 11 - 51 U/L    Comment: Performed at [facility], [address] [place], [place] [address]  Troponin I (High Sensitivity)     Status: None   Collection Time: 10/02/23  8:20 AM  Result Value Ref Range   Troponin I  (High Sensitivity) 4 <18 ng/L    Comment: (NOTE) Elevated high sensitivity troponin I (hsTnI) values and significant  changes across serial measurements may suggest ACS but many other  chronic and acute conditions are known to elevate hsTnI results.  Refer to the Links section for chest pain algorithms and additional  guidance. Performed at [facility], [address] [place], [place] [address]   Magnesium      Status: None   Collection Time: 10/02/23  8:20 AM  Result Value Ref Range   Magnesium  1.7 1.7 - 2.4 mg/dL    Comment: Performed at [facility], [address] [place], [place] [address]  Troponin I (High Sensitivity)     Status: None   Collection Time: 10/02/23 10:15 AM  Result Value Ref Range   Troponin I (High Sensitivity) 7 <18 ng/L    Comment: (NOTE) Elevated high sensitivity troponin I (hsTnI) values and significant  changes across serial measurements may suggest ACS but many other  chronic and acute conditions are known to elevate hsTnI results.  Refer to the Links section for chest pain algorithms and additional  guidance. Performed at [facility], [address] [place], [place] [address]   Blood gas, venous     Status: Abnormal   Collection Time: 11/24/23  7:42 PM  Result Value Ref Range   pH, Ven 7.29 7.25 - 7.43   pCO2, Ven 42 (L) 44 - 60 mmHg   Bicarbonate 20.2 20.0 - 28.0 mmol/L   Acid-base deficit 6.1 (H) 0.0 - 2.0 mmol/L   O2 Saturation 40.5 %   Patient temperature 37.0    Collection site VEIN     Comment: Performed at [facility], [address] [place], [place] [address]  CBC with Differential     Status: None   Collection Time: 11/24/23  8:01 PM  Result Value Ref Range   WBC 8.8 4.0 - 10.5 K/uL   RBC 4.58 3.87 - 5.11 MIL/uL   Hemoglobin 13.2 12.0 - 15.0 g/dL   HCT 59.8 63.9 - 53.9 %   MCV 87.6 80.0 - 100.0 fL   MCH 28.8 26.0 - 34.0 pg   MCHC 32.9 30.0 - 36.0 g/dL   RDW 85.0 88.4 - 84.4 %    Platelets 348 150 - 400 K/uL   nRBC 0.0 0.0 - 0.2 %   Neutrophils Relative % 64 %   Neutro Abs 5.6 1.7 - 7.7 K/uL   Lymphocytes Relative 31 %   Lymphs Abs 2.7 0.7 - 4.0 K/uL   Monocytes Relative 5 %   Monocytes Absolute 0.5 0.1 - 1.0 K/uL   Eosinophils Relative 0 %   Eosinophils Absolute 0.0 0.0 - 0.5 K/uL  Basophils Relative 0 %   Basophils Absolute 0.0 0.0 - 0.1 K/uL   Immature Granulocytes 0 %   Abs Immature Granulocytes 0.03 0.00 - 0.07 K/uL    Comment: Performed at [facility], [address] [place], [place] [address]  Basic metabolic panel     Status: Abnormal   Collection Time: 11/24/23  8:01 PM  Result Value Ref Range   Sodium 133 (L) 135 - 145 mmol/L    Comment: ELECTROLYTES REPEATED TO VERIFY MU   Potassium 4.8 3.5 - 5.1 mmol/L   Chloride 93 (L) 98 - 111 mmol/L   CO2 18 (L) 22 - 32 mmol/L   Glucose, Bld 516 (HH) 70 - 99 mg/dL    Comment: CRITICAL RESULT CALLED TO, READ BACK BY AND VERIFIED WITH ELLIE MANGAROO 11/24/23 2105 MU Glucose reference range applies only to samples taken after fasting for at least 8 hours.    BUN 33 (H) 6 - 20 mg/dL   Creatinine, Ser 8.35 (H) 0.44 - 1.00 mg/dL   Calcium  10.6 (H) 8.9 - 10.3 mg/dL   GFR, Estimated 37 (L) >60 mL/min    Comment: (NOTE) Calculated using the CKD-EPI Creatinine Equation (2021)    Anion gap 22 (H) 5 - 15    Comment: Performed at [facility], [address] [place], [place] [address]  Brain natriuretic peptide     Status: None   Collection Time: 11/24/23  8:01 PM  Result Value Ref Range   B Natriuretic Peptide 14.0 0.0 - 100.0 pg/mL    Comment: Performed at [facility], [address] [place], [place] [address]  Resp panel by RT-PCR (RSV, Flu A&B, Covid) Anterior Nasal Swab     Status: None   Collection Time: 11/24/23  8:01 PM   Specimen: Anterior Nasal Swab  Result Value Ref Range   SARS Coronavirus 2 by RT PCR NEGATIVE NEGATIVE    Comment: (NOTE) SARS-CoV-2 target nucleic  acids are NOT DETECTED.  The SARS-CoV-2 RNA is generally detectable in upper respiratory specimens during the acute phase of infection. The lowest concentration of SARS-CoV-2 viral copies this assay can detect is 138 copies/mL. A negative result does not preclude SARS-Cov-2 infection and should not be used as the sole basis for treatment or other patient management decisions. A negative result may occur with  improper specimen collection/handling, submission of specimen other than nasopharyngeal swab, presence of viral mutation(s) within the areas targeted by this assay, and inadequate number of viral copies(<138 copies/mL). A negative result must be combined with clinical observations, patient history, and epidemiological information. The expected result is Negative.  Fact Sheet for Patients:  BloggerCourse.com  Fact Sheet for Healthcare Providers:  SeriousBroker.it  This test is no t yet approved or cleared by the [place]  FDA and  has been authorized for detection and/or diagnosis of SARS-CoV-2 by FDA under an Emergency Use Authorization (EUA). This EUA will remain  in effect (meaning this test can be used) for the duration of the COVID-19 declaration under Section 564(b)(1) of the Act, 21 U.S.C.section 360bbb-3(b)(1), unless the authorization is terminated  or revoked sooner.       Influenza A by PCR NEGATIVE NEGATIVE   Influenza B by PCR NEGATIVE NEGATIVE    Comment: (NOTE) The Xpert Xpress SARS-CoV-2/FLU/RSV plus assay is intended as an aid in the diagnosis of influenza from Nasopharyngeal swab specimens and should not be used as a sole basis for treatment. Nasal washings and aspirates are unacceptable for Xpert  Xpress SARS-CoV-2/FLU/RSV testing.  Fact Sheet for Patients: BloggerCourse.com  Fact Sheet for Healthcare Providers: SeriousBroker.it  This test is not  yet approved or cleared by the [place]  FDA and has been authorized for detection and/or diagnosis of SARS-CoV-2 by FDA under an Emergency Use Authorization (EUA). This EUA will remain in effect (meaning this test can be used) for the duration of the COVID-19 declaration under Section 564(b)(1) of the Act, 21 U.S.C. section 360bbb-3(b)(1), unless the authorization is terminated or revoked.     Resp Syncytial Virus by PCR NEGATIVE NEGATIVE    Comment: (NOTE) Fact Sheet for Patients: BloggerCourse.com  Fact Sheet for Healthcare Providers: SeriousBroker.it  This test is not yet approved or cleared by the [place]  FDA and has been authorized for detection and/or diagnosis of SARS-CoV-2 by FDA under an Emergency Use Authorization (EUA). This EUA will remain in effect (meaning this test can be used) for the duration of the COVID-19 declaration under Section 564(b)(1) of the Act, 21 U.S.C. section 360bbb-3(b)(1), unless the authorization is terminated or revoked.  Performed at [facility], [address] [place], [place] [address]   CBG monitoring, ED     Status: Abnormal   Collection Time: 11/24/23  8:05 PM  Result Value Ref Range   Glucose-Capillary 486 (H) 70 - 99 mg/dL    Comment: Glucose reference range applies only to samples taken after fasting for at least 8 hours.  Basic metabolic panel     Status: Abnormal   Collection Time: 11/24/23  9:35 PM  Result Value Ref Range   Sodium 133 (L) 135 - 145 mmol/L    Comment: ELECTROLYTES REPEATED TO VERIFY MU   Potassium 4.8 3.5 - 5.1 mmol/L   Chloride 97 (L) 98 - 111 mmol/L   CO2 16 (L) 22 - 32 mmol/L   Glucose, Bld 462 (H) 70 - 99 mg/dL    Comment: Glucose reference range applies only to samples taken after fasting for at least 8 hours.   BUN 30 (H) 6 - 20 mg/dL   Creatinine, Ser 8.57 (H) 0.44 - 1.00 mg/dL   Calcium  9.9 8.9 - 10.3 mg/dL   GFR, Estimated 44 (L)  >60 mL/min    Comment: (NOTE) Calculated using the CKD-EPI Creatinine Equation (2021)    Anion gap 20 (H) 5 - 15    Comment: Performed at [facility], [address] [place], [place] [address]  Beta-hydroxybutyric acid     Status: Abnormal   Collection Time: 11/24/23  9:35 PM  Result Value Ref Range   Beta-Hydroxybutyric Acid 6.51 (H) 0.05 - 0.27 mmol/L    Comment: RESULT CONFIRMED BY MANUAL DILUTION ASW Performed at [facility], [address] [place], [place] [address]   CBG monitoring, ED     Status: Abnormal   Collection Time: 11/24/23  9:48 PM  Result Value Ref Range   Glucose-Capillary 414 (H) 70 - 99 mg/dL    Comment: Glucose reference range applies only to samples taken after fasting for at least 8 hours.  CBG monitoring, ED     Status: Abnormal   Collection Time: 11/24/23 10:18 PM  Result Value Ref Range   Glucose-Capillary 441 (H) 70 - 99 mg/dL    Comment: Glucose reference range applies only to samples taken after fasting for at least 8 hours.  CBG monitoring, ED     Status: Abnormal   Collection Time: 11/24/23 10:20 PM  Result Value Ref Range   Glucose-Capillary 466 (H) 70 -  99 mg/dL    Comment: Glucose reference range applies only to samples taken after fasting for at least 8 hours.  CBG monitoring, ED     Status: Abnormal   Collection Time: 11/24/23 10:48 PM  Result Value Ref Range   Glucose-Capillary 414 (H) 70 - 99 mg/dL    Comment: Glucose reference range applies only to samples taken after fasting for at least 8 hours.  MRSA Next Gen by PCR, Nasal     Status: None   Collection Time: 11/24/23 11:02 PM   Specimen: Nasal Mucosa; Nasal Swab  Result Value Ref Range   MRSA by PCR Next Gen NOT DETECTED NOT DETECTED    Comment: (NOTE) The GeneXpert MRSA Assay (FDA approved for NASAL specimens only), is one component of a comprehensive MRSA colonization surveillance program. It is not intended to diagnose MRSA infection nor to guide or  monitor treatment for MRSA infections. Test performance is not FDA approved in patients less than 62 years old. Performed at [facility], [address] [place], [place] [address]   Glucose, capillary     Status: Abnormal   Collection Time: 11/24/23 11:22 PM  Result Value Ref Range   Glucose-Capillary 380 (H) 70 - 99 mg/dL    Comment: Glucose reference range applies only to samples taken after fasting for at least 8 hours.  Urinalysis, Routine w reflex microscopic -Urine, Clean Catch     Status: Abnormal   Collection Time: 11/24/23 11:23 PM  Result Value Ref Range   Color, Urine YELLOW (A) YELLOW   APPearance HAZY (A) CLEAR   Specific Gravity, Urine 1.032 (H) 1.005 - 1.030   pH 5.0 5.0 - 8.0   Glucose, UA >=500 (A) NEGATIVE mg/dL   Hgb urine dipstick MODERATE (A) NEGATIVE   Bilirubin Urine NEGATIVE NEGATIVE   Ketones, ur 80 (A) NEGATIVE mg/dL   Protein, ur 30 (A) NEGATIVE mg/dL   Nitrite NEGATIVE NEGATIVE   Leukocytes,Ua MODERATE (A) NEGATIVE   RBC / HPF 21-50 0 - 5 RBC/hpf   WBC, UA 21-50 0 - 5 WBC/hpf   Bacteria, UA NONE SEEN NONE SEEN   Squamous Epithelial / HPF 0-5 0 - 5 /HPF   Mucus PRESENT     Comment: Performed at [facility], [address] [place], [place] [address]  Glucose, capillary     Status: Abnormal   Collection Time: 11/25/23 12:17 AM  Result Value Ref Range   Glucose-Capillary 335 (H) 70 - 99 mg/dL    Comment: Glucose reference range applies only to samples taken after fasting for at least 8 hours.  Glucose, capillary     Status: Abnormal   Collection Time: 11/25/23  1:27 AM  Result Value Ref Range   Glucose-Capillary 218 (H) 70 - 99 mg/dL    Comment: Glucose reference range applies only to samples taken after fasting for at least 8 hours.  Basic metabolic panel     Status: Abnormal   Collection Time: 11/25/23  1:50 AM  Result Value Ref Range   Sodium 141 135 - 145 mmol/L   Potassium 4.5 3.5 - 5.1 mmol/L   Chloride 102 98 - 111  mmol/L   CO2 21 (L) 22 - 32 mmol/L   Glucose, Bld 189 (H) 70 - 99 mg/dL    Comment: Glucose reference range applies only to samples taken after fasting for at least 8 hours.   BUN 29 (H) 6 - 20 mg/dL   Creatinine, Ser 8.67 (H) 0.44 - 1.00  mg/dL   Calcium  10.4 (H) 8.9 - 10.3 mg/dL   GFR, Estimated 48 (L) >60 mL/min    Comment: (NOTE) Calculated using the CKD-EPI Creatinine Equation (2021)    Anion gap 18 (H) 5 - 15    Comment: Performed at [facility], [address] [place], [place] [address]  Beta-hydroxybutyric acid     Status: Abnormal   Collection Time: 11/25/23  1:50 AM  Result Value Ref Range   Beta-Hydroxybutyric Acid 2.59 (H) 0.05 - 0.27 mmol/L    Comment: Performed at [facility], [address] [place], [place] [address]  Glucose, capillary     Status: Abnormal   Collection Time: 11/25/23  2:30 AM  Result Value Ref Range   Glucose-Capillary 195 (H) 70 - 99 mg/dL    Comment: Glucose reference range applies only to samples taken after fasting for at least 8 hours.  Glucose, capillary     Status: Abnormal   Collection Time: 11/25/23  3:31 AM  Result Value Ref Range   Glucose-Capillary 212 (H) 70 - 99 mg/dL    Comment: Glucose reference range applies only to samples taken after fasting for at least 8 hours.  Glucose, capillary     Status: Abnormal   Collection Time: 11/25/23  4:30 AM  Result Value Ref Range   Glucose-Capillary 196 (H) 70 - 99 mg/dL    Comment: Glucose reference range applies only to samples taken after fasting for at least 8 hours.  Basic metabolic panel     Status: Abnormal   Collection Time: 11/25/23  4:36 AM  Result Value Ref Range   Sodium 139 135 - 145 mmol/L   Potassium 4.3 3.5 - 5.1 mmol/L   Chloride 102 98 - 111 mmol/L   CO2 21 (L) 22 - 32 mmol/L   Glucose, Bld 197 (H) 70 - 99 mg/dL    Comment: Glucose reference range applies only to samples taken after fasting for at least 8 hours.   BUN 27 (H) 6 - 20 mg/dL    Creatinine, Ser 8.86 (H) 0.44 - 1.00 mg/dL   Calcium  10.1 8.9 - 10.3 mg/dL   GFR, Estimated 57 (L) >60 mL/min    Comment: (NOTE) Calculated using the CKD-EPI Creatinine Equation (2021)    Anion gap 16 (H) 5 - 15    Comment: Performed at [facility], [address] [place], [place] [address]  Beta-hydroxybutyric acid     Status: Abnormal   Collection Time: 11/25/23  4:36 AM  Result Value Ref Range   Beta-Hydroxybutyric Acid 1.90 (H) 0.05 - 0.27 mmol/L    Comment: Performed at [facility], [address] [place], [place] [address]  HIV Antibody (routine testing w rflx)     Status: None   Collection Time: 11/25/23  4:36 AM  Result Value Ref Range   HIV Screen 4th Generation wRfx Non Reactive Non Reactive    Comment: Performed at [facility], [address], [place], [place] [address]  Glucose, capillary     Status: Abnormal   Collection Time: 11/25/23  5:35 AM  Result Value Ref Range   Glucose-Capillary 221 (H) 70 - 99 mg/dL    Comment: Glucose reference range applies only to samples taken after fasting for at least 8 hours.  Glucose, capillary     Status: Abnormal   Collection Time: 11/25/23  6:32 AM  Result Value Ref Range   Glucose-Capillary 193 (H) 70 - 99 mg/dL    Comment:  Glucose reference range applies only to samples taken after fasting for at least 8 hours.  Glucose, capillary     Status: Abnormal   Collection Time: 11/25/23  7:25 AM  Result Value Ref Range   Glucose-Capillary 186 (H) 70 - 99 mg/dL    Comment: Glucose reference range applies only to samples taken after fasting for at least 8 hours.  Glucose, capillary     Status: Abnormal   Collection Time: 11/25/23  8:26 AM  Result Value Ref Range   Glucose-Capillary 189 (H) 70 - 99 mg/dL    Comment: Glucose reference range applies only to samples taken after fasting for at least 8 hours.  Glucose, capillary     Status: Abnormal   Collection Time: 11/25/23  9:29 AM  Result Value Ref Range    Glucose-Capillary 157 (H) 70 - 99 mg/dL    Comment: Glucose reference range applies only to samples taken after fasting for at least 8 hours.  Basic metabolic panel     Status: Abnormal   Collection Time: 11/25/23 10:01 AM  Result Value Ref Range   Sodium 137 135 - 145 mmol/L   Potassium 3.5 3.5 - 5.1 mmol/L   Chloride 103 98 - 111 mmol/L   CO2 23 22 - 32 mmol/L   Glucose, Bld 162 (H) 70 - 99 mg/dL    Comment: Glucose reference range applies only to samples taken after fasting for at least 8 hours.   BUN 24 (H) 6 - 20 mg/dL   Creatinine, Ser 8.94 (H) 0.44 - 1.00 mg/dL   Calcium  10.0 8.9 - 10.3 mg/dL   GFR, Estimated >39 >39 mL/min    Comment: (NOTE) Calculated using the CKD-EPI Creatinine Equation (2021)    Anion gap 11 5 - 15    Comment: Performed at [facility], [address] [place], [place] [address]  Beta-hydroxybutyric acid     Status: Abnormal   Collection Time: 11/25/23 10:01 AM  Result Value Ref Range   Beta-Hydroxybutyric Acid 0.34 (H) 0.05 - 0.27 mmol/L    Comment: Performed at [facility], [address] [place], [place] [address]  Magnesium      Status: None   Collection Time: 11/25/23 10:01 AM  Result Value Ref Range   Magnesium  2.0 1.7 - 2.4 mg/dL    Comment: Performed at [facility], [address], [place], [place] [address]  Phosphorus     Status: None   Collection Time: 11/25/23 10:01 AM  Result Value Ref Range   Phosphorus 2.5 2.5 - 4.6 mg/dL    Comment: Performed at [facility], [address] [place], [place] [address]  Glucose, capillary     Status: Abnormal   Collection Time: 11/25/23 10:27 AM  Result Value Ref Range   Glucose-Capillary 184 (H) 70 - 99 mg/dL    Comment: Glucose reference range applies only to samples taken after fasting for at least 8 hours.  Glucose, capillary     Status: Abnormal   Collection Time: 11/25/23 11:25 AM  Result Value Ref Range   Glucose-Capillary 194 (H) 70 - 99 mg/dL     Comment: Glucose reference range applies only to samples taken after fasting for at least 8 hours.  Glucose, capillary     Status: Abnormal   Collection Time: 11/25/23 12:38 PM  Result Value Ref Range   Glucose-Capillary 166 (H) 70 - 99 mg/dL    Comment: Glucose reference range applies only to samples taken after fasting for at  least 8 hours.  Basic metabolic panel     Status: Abnormal   Collection Time: 11/25/23  1:49 PM  Result Value Ref Range   Sodium 136 135 - 145 mmol/L   Potassium 3.6 3.5 - 5.1 mmol/L   Chloride 104 98 - 111 mmol/L   CO2 23 22 - 32 mmol/L   Glucose, Bld 173 (H) 70 - 99 mg/dL    Comment: Glucose reference range applies only to samples taken after fasting for at least 8 hours.   BUN 22 (H) 6 - 20 mg/dL   Creatinine, Ser 9.00 0.44 - 1.00 mg/dL   Calcium  9.6 8.9 - 10.3 mg/dL   GFR, Estimated >39 >39 mL/min    Comment: (NOTE) Calculated using the CKD-EPI Creatinine Equation (2021)    Anion gap 9 5 - 15    Comment: Performed at [facility], [address] [place], [place] [address]  Beta-hydroxybutyric acid     Status: Abnormal   Collection Time: 11/25/23  1:49 PM  Result Value Ref Range   Beta-Hydroxybutyric Acid 0.36 (H) 0.05 - 0.27 mmol/L    Comment: Performed at [facility], [address] [place], [place] [address]  Glucose, capillary     Status: Abnormal   Collection Time: 11/25/23  1:55 PM  Result Value Ref Range   Glucose-Capillary 184 (H) 70 - 99 mg/dL    Comment: Glucose reference range applies only to samples taken after fasting for at least 8 hours.  Glucose, capillary     Status: Abnormal   Collection Time: 11/25/23  4:30 PM  Result Value Ref Range   Glucose-Capillary 304 (H) 70 - 99 mg/dL    Comment: Glucose reference range applies only to samples taken after fasting for at least 8 hours.  Glucose, capillary     Status: Abnormal   Collection Time: 11/25/23  9:41 PM  Result Value Ref Range   Glucose-Capillary 336 (H)  70 - 99 mg/dL    Comment: Glucose reference range applies only to samples taken after fasting for at least 8 hours.  Glucose, capillary     Status: Abnormal   Collection Time: 11/25/23 11:52 PM  Result Value Ref Range   Glucose-Capillary 340 (H) 70 - 99 mg/dL    Comment: Glucose reference range applies only to samples taken after fasting for at least 8 hours.  Basic metabolic panel with GFR     Status: Abnormal   Collection Time: 11/26/23  4:53 AM  Result Value Ref Range   Sodium 135 135 - 145 mmol/L   Potassium 3.9 3.5 - 5.1 mmol/L   Chloride 105 98 - 111 mmol/L   CO2 21 (L) 22 - 32 mmol/L   Glucose, Bld 252 (H) 70 - 99 mg/dL    Comment: Glucose reference range applies only to samples taken after fasting for at least 8 hours.   BUN 22 (H) 6 - 20 mg/dL   Creatinine, Ser 9.21 0.44 - 1.00 mg/dL   Calcium  9.3 8.9 - 10.3 mg/dL   GFR, Estimated >39 >39 mL/min    Comment: (NOTE) Calculated using the CKD-EPI Creatinine Equation (2021)    Anion gap 9 5 - 15    Comment: Performed at [facility], [address] [place], [place] [address]  CBC     Status: Abnormal   Collection Time: 11/26/23  4:53 AM  Result Value Ref Range   WBC 6.8 4.0 - 10.5 K/uL   RBC 3.99 3.87 - 5.11 MIL/uL  Hemoglobin 11.3 (L) 12.0 - 15.0 g/dL   HCT 65.1 (L) 63.9 - 53.9 %   MCV 87.2 80.0 - 100.0 fL   MCH 28.3 26.0 - 34.0 pg   MCHC 32.5 30.0 - 36.0 g/dL   RDW 84.9 88.4 - 84.4 %   Platelets 291 150 - 400 K/uL   nRBC 0.0 0.0 - 0.2 %    Comment: Performed at [facility], [address], [place], [place] [address]  Magnesium      Status: None   Collection Time: 11/26/23  4:53 AM  Result Value Ref Range   Magnesium  1.8 1.7 - 2.4 mg/dL    Comment: Performed at [facility], [address] [place], [place] [address]  Phosphorus     Status: Abnormal   Collection Time: 11/26/23  4:53 AM  Result Value Ref Range   Phosphorus 2.0 (L) 2.5 - 4.6 mg/dL    Comment: Performed at [facility], [address] [place], [place] [address]  Hepatic function panel     Status: Abnormal   Collection Time: 11/26/23  4:53 AM  Result Value Ref Range   Total Protein 6.7 6.5 - 8.1 g/dL   Albumin 3.0 (L) 3.5 - 5.0 g/dL   AST 32 15 - 41 U/L   ALT 22 0 - 44 U/L   Alkaline Phosphatase 79 38 - 126 U/L   Total Bilirubin 0.5 0.0 - 1.2 mg/dL   Bilirubin, Direct <9.8 0.0 - 0.2 mg/dL   Indirect Bilirubin NOT CALCULATED 0.3 - 0.9 mg/dL    Comment: Performed at [facility], [address] [place], [place] [address]  Glucose, capillary     Status: Abnormal   Collection Time: 11/26/23  7:48 AM  Result Value Ref Range   Glucose-Capillary 332 (H) 70 - 99 mg/dL    Comment: Glucose reference range applies only to samples taken after fasting for at least 8 hours.  Glucose, capillary     Status: Abnormal   Collection Time: 11/26/23 12:08 PM  Result Value Ref Range   Glucose-Capillary 318 (H) 70 - 99 mg/dL    Comment: Glucose reference range applies only to samples taken after fasting for at least 8 hours.  Glucose, capillary     Status: Abnormal   Collection Time: 11/26/23  4:45 PM  Result Value Ref Range   Glucose-Capillary 307 (H) 70 - 99 mg/dL    Comment: Glucose reference range applies only to samples taken after fasting for at least 8 hours.  Glucose, capillary     Status: Abnormal   Collection Time: 11/26/23  7:54 PM  Result Value Ref Range   Glucose-Capillary 371 (H) 70 - 99 mg/dL    Comment: Glucose reference range applies only to samples taken after fasting for at least 8 hours.  Basic metabolic panel with GFR     Status: Abnormal   Collection Time: 11/27/23  4:47 AM  Result Value Ref Range   Sodium 138 135 - 145 mmol/L   Potassium 3.4 (L) 3.5 - 5.1 mmol/L   Chloride 106 98 - 111 mmol/L   CO2 22 22 - 32 mmol/L   Glucose, Bld 162 (H) 70 - 99 mg/dL    Comment: Glucose reference range applies only to samples taken after fasting for at least 8 hours.   BUN 16 6 -  20 mg/dL   Creatinine, Ser 9.41 0.44 - 1.00 mg/dL   Calcium  8.7 (L) 8.9 - 10.3 mg/dL   GFR, Estimated >39 >  60 mL/min    Comment: (NOTE) Calculated using the CKD-EPI Creatinine Equation (2021)    Anion gap 10 5 - 15    Comment: Performed at [facility], [address] [place], [place] [address]  CBC     Status: Abnormal   Collection Time: 11/27/23  4:47 AM  Result Value Ref Range   WBC 6.8 4.0 - 10.5 K/uL   RBC 3.87 3.87 - 5.11 MIL/uL   Hemoglobin 10.9 (L) 12.0 - 15.0 g/dL   HCT 65.7 (L) 63.9 - 53.9 %   MCV 88.4 80.0 - 100.0 fL   MCH 28.2 26.0 - 34.0 pg   MCHC 31.9 30.0 - 36.0 g/dL   RDW 84.7 88.4 - 84.4 %   Platelets 257 150 - 400 K/uL   nRBC 0.0 0.0 - 0.2 %    Comment: Performed at [facility], [address] [place], [place] [address]  Magnesium      Status: None   Collection Time: 11/27/23  4:47 AM  Result Value Ref Range   Magnesium  1.9 1.7 - 2.4 mg/dL    Comment: Performed at [facility], [address] [place], [place] [address]  Phosphorus     Status: None   Collection Time: 11/27/23  4:47 AM  Result Value Ref Range   Phosphorus 3.2 2.5 - 4.6 mg/dL    Comment: Performed at [facility], [address] [place], [place] [address]  Hepatic function panel     Status: Abnormal   Collection Time: 11/27/23  4:47 AM  Result Value Ref Range   Total Protein 6.7 6.5 - 8.1 g/dL   Albumin 3.0 (L) 3.5 - 5.0 g/dL   AST 25 15 - 41 U/L   ALT 20 0 - 44 U/L   Alkaline Phosphatase 75 38 - 126 U/L   Total Bilirubin 0.6 0.0 - 1.2 mg/dL   Bilirubin, Direct <9.8 0.0 - 0.2 mg/dL   Indirect Bilirubin NOT CALCULATED 0.3 - 0.9 mg/dL    Comment: Performed at [facility], [address] [place], [place] [address]  Hemoglobin A1c     Status: Abnormal   Collection Time: 11/27/23  4:47 AM  Result Value Ref Range   Hgb A1c MFr Bld 12.7 (H) 4.8 - 5.6 %    Comment: (NOTE)         Prediabetes: 5.7 - 6.4         Diabetes: >6.4          Glycemic control for adults with diabetes: <7.0    Mean Plasma Glucose 318 mg/dL    Comment: (NOTE) Performed At: [facility] [address] [place], [place] [id number] Jennette Shorter MD Ey:[id number]   Glucose, capillary     Status: Abnormal   Collection Time: 11/27/23  7:28 AM  Result Value Ref Range   Glucose-Capillary 162 (H) 70 - 99 mg/dL    Comment: Glucose reference range applies only to samples taken after fasting for at least 8 hours.  Glucose, capillary     Status: Abnormal   Collection Time: 11/27/23  1:02 PM  Result Value Ref Range   Glucose-Capillary 233 (H) 70 - 99 mg/dL    Comment: Glucose reference range applies only to samples taken after fasting for at least 8 hours.  POCT Urine Albumin/Creatinine with ratio [[id number]]     Status: Abnormal   Collection Time: 12/01/23 10:13 AM  Result Value Ref Range   Microalbumin Ur, POC 150 mg/L   Creatinine, POC  100 mg/dL   Albumin/Creatinine Ratio, Urine, POC 30-300       Assessment & Plan:  Referral sent to endocrinology Referral sent to GYN Complete Cologuard MRI order placed Schedule mammogram  Problem List Items Addressed This Visit       Cardiovascular and Mediastinum   Primary hypertension - Primary   Relevant Medications   propranolol (INDERAL) 20 MG tablet     Digestive   Liver lesions   Relevant Orders   MR LIVER W WO CONTRAST     Endocrine   Diabetes mellitus without complication (HCC)   Relevant Orders   Ambulatory referral to Endocrinology     Other   Mixed hyperlipidemia   Relevant Medications   propranolol (INDERAL) 20 MG tablet   Other Visit Diagnoses       Uterine leiomyoma, unspecified location       Relevant Orders   Ambulatory referral to Gynecology     Adrenal mass 1 cm to 4 cm in diameter       Relevant Orders   MR LIVER W WO CONTRAST       Return in about 9 weeks (around 03/02/2024) for fasting lab work prior.   Total time spent: 25 minutes  [employer],  NP  12/30/2023   This document may have been prepared by Dragon Voice Recognition software and as such may include unintentional dictation errors.

## 2024-01-09 ENCOUNTER — Other Ambulatory Visit: Payer: Self-pay | Admitting: Physician Assistant

## 2024-01-09 ENCOUNTER — Ambulatory Visit
Admission: RE | Admit: 2024-01-09 | Discharge: 2024-01-09 | Disposition: A | Discharge status: Home / Self Care | Source: Ambulatory Visit | Attending: Physician Assistant | Admitting: Physician Assistant

## 2024-01-09 DIAGNOSIS — H9313 Tinnitus, bilateral: Secondary | ICD-10-CM | POA: Insufficient documentation

## 2024-01-09 DIAGNOSIS — H538 Other visual disturbances: Secondary | ICD-10-CM | POA: Insufficient documentation

## 2024-01-09 DIAGNOSIS — R42 Dizziness and giddiness: Secondary | ICD-10-CM | POA: Diagnosis present

## 2024-01-09 DIAGNOSIS — R519 Headache, unspecified: Secondary | ICD-10-CM | POA: Insufficient documentation

## 2024-01-09 MED ORDER — GADOBUTROL 1 MMOL/ML IV SOLN: 9.0000 mL | Freq: Once | INTRAVENOUS | Status: DC | PRN

## 2024-04-13 ENCOUNTER — Ambulatory Visit: Admitting: Family Medicine

## 2024-04-13 ENCOUNTER — Other Ambulatory Visit (HOSPITAL_COMMUNITY)
Admission: RE | Admit: 2024-04-13 | Discharge: 2024-04-13 | Disposition: A | Source: Ambulatory Visit | Attending: Family Medicine | Admitting: Family Medicine

## 2024-04-13 ENCOUNTER — Encounter: Payer: Self-pay | Admitting: Family Medicine

## 2024-04-13 VITALS — BP 172/91 | HR 81 | Wt 190.0 lb

## 2024-04-13 DIAGNOSIS — E119 Type 2 diabetes mellitus without complications: Secondary | ICD-10-CM

## 2024-04-13 DIAGNOSIS — Z794 Long term (current) use of insulin: Secondary | ICD-10-CM | POA: Diagnosis not present

## 2024-04-13 DIAGNOSIS — I10 Essential (primary) hypertension: Secondary | ICD-10-CM

## 2024-04-13 DIAGNOSIS — A599 Trichomoniasis, unspecified: Secondary | ICD-10-CM | POA: Diagnosis not present

## 2024-04-13 DIAGNOSIS — D219 Benign neoplasm of connective and other soft tissue, unspecified: Secondary | ICD-10-CM

## 2024-04-13 DIAGNOSIS — D649 Anemia, unspecified: Secondary | ICD-10-CM

## 2024-04-13 DIAGNOSIS — Z01419 Encounter for gynecological examination (general) (routine) without abnormal findings: Secondary | ICD-10-CM | POA: Insufficient documentation

## 2024-04-13 MED ORDER — INSULIN ASPART FLEXPEN 100 UNIT/ML ~~LOC~~ SOPN: 5.0000 [IU] | PEN_INJECTOR | Freq: Three times a day (TID) | SUBCUTANEOUS | 3 refills | Status: AC

## 2024-04-13 MED ORDER — INSULIN GLARGINE 100 UNIT/ML SOLOSTAR PEN: 20.0000 [IU] | PEN_INJECTOR | Freq: Every day | SUBCUTANEOUS | 3 refills | Status: AC

## 2024-04-13 MED ORDER — BD PEN NEEDLE NANO 2ND GEN 32G X 4 MM MISC: 1.0000 | Freq: Every day | 0 refills | Status: AC

## 2024-04-13 NOTE — Progress Notes (Signed)
 New pt to office needs routine gyn exam, discuss fibroids, pt states she feels she is having daily orgasms. Last gyn exam was years ago.  Pt needs Mammo ordered.  Pt needs new PCP.  Pt does have elevated BP today, currently on combo med.

## 2024-04-13 NOTE — Patient Instructions (Addendum)
 For Novolog  - Take 5 units with meals (three times per day) - You can also take additional units based on your blood sugar after eating like you were doing which is called a sliding scan.

## 2024-04-13 NOTE — Progress Notes (Unsigned)
 "  GYNECOLOGY ANNUAL PREVENTATIVE CARE ENCOUNTER NOTE  Subjective:  Vanessa Wyatt is a 59 y.o. No obstetric history on file. female here for a routine annual gynecologic exam.  Current complaints: Has not had GYN care in years.   Concerns today - Discuss fibroids-- noted on CT abdomen. She reports no pain and no bleeding. She has not had a menses or any vaginal bleeding in well over 1 year.   Read from the CT on 11/26/23 Reproductive: Multiple fibroids are noted within the uterus. There are large bilateral masses measuring up to 10.6 x 6.8 cm on the left and 11.3 x 8.6 cm on the right. Additional masses are noted anterior to the uterus. - having spontaneous orgasms multiple times per day-- for the past 1 month. New symptoms. She reports waking at night due to discharging. She describes releasing fluid as her orgasm - has not established PCP care since admission for DKA in August. She is about out of insulin  and HTN meds. She reports she did not take her medication today.   Denies abnormal vaginal bleeding, discharge, pelvic pain, problems with intercourse or other gynecologic concerns.    Gynecologic History Patient's last menstrual period was 03/29/2020. Contraception: none Last Pap: Years ago.  Last mammogram: years ago  CRC- never had and chickened out multiple times. Has been referred for cologuard as well. P  Health Maintenance Due  Topic Date Due   FOOT EXAM  Never done   Hepatitis C Screening  Never done   DTaP/Tdap/Td (1 - Tdap) Never done   Pneumococcal Vaccine: 50+ Years (1 of 2 - PCV) Never done   Hepatitis B Vaccines 19-59 Average Risk (1 of 3 - 19+ 3-dose series) Never done   Cervical Cancer Screening (HPV/Pap Cotest)  Never done   Mammogram  Never done   Colonoscopy  Never done   Zoster Vaccines- Shingrix (1 of 2) Never done   Influenza Vaccine  Never done   COVID-19 Vaccine (1 - 2025-26 season) Never done      04/13/2024    9:35 AM 08/04/2023    1:00 PM  GAD  7 : Generalized Anxiety Score  Nervous, Anxious, on Edge 0 0  Control/stop worrying 0 0  Worry too much - different things 0 1  Trouble relaxing 0 0  Restless 0 1  Easily annoyed or irritable 0 0  Afraid - awful might happen 0 0  Total GAD 7 Score 0 2       04/13/2024    9:36 AM 08/04/2023    1:00 PM  PHQ9 SCORE ONLY  PHQ-9 Total Score 0 5      Data saved with a previous flowsheet row definition     The following portions of the patient's history were reviewed and updated as appropriate: allergies, current medications, past family history, past medical history, past social history, past surgical history and problem list.  Review of Systems Pertinent items are noted in HPI.   Objective:  BP (!) 172/91   Pulse 81   Wt 190 lb (86.2 kg)   LMP 03/29/2020   BMI 34.75 kg/m   BP Readings from Last 3 Encounters:  04/13/24 (!) 172/91  12/30/23 108/68  12/01/23 126/84   CONSTITUTIONAL: Well-developed, well-nourished female in no acute distress.  HENT:  Normocephalic, atraumatic, External right and left ear normal. Oropharynx is clear and moist EYES:  No scleral icterus.  NECK: Normal range of motion, supple, no masses.  Normal thyroid.  SKIN: Skin  is warm and dry. No rash noted. Not diaphoretic. No erythema. No pallor. NEUROLOGIC: Alert and oriented to person, place, and time. Normal reflexes, muscle tone coordination. No cranial nerve deficit noted. PSYCHIATRIC: Normal mood and affect. Normal behavior. Normal judgment and thought content. CARDIOVASCULAR: Normal heart rate noted, regular rhythm. 2+ distal pulses. RESPIRATORY: Effort and breath sounds normal, no problems with respiration noted. BREASTS: Symmetric in size. No masses, skin changes, nipple drainage, or lymphadenopathy. ABDOMEN: Soft,  no distention noted.  No tenderness, rebound or guarding.  PELVIC: Normal appearing external genitalia; Mildly atrophic vaginal mucosa and cervix. No abnormal discharge noted.  Very  challenging exam, there is a hard mass posteriorly (suspect fibroid) but this is concerning. This prevented visualization of the cervix and also was firm and behind vaginal mucosa. ?this might be her large fibroids seen on CT Pap smear obtained.  Normal uterine size, no other palpable masses, no uterine or adnexal tenderness. Chaperone present for exam MUSCULOSKELETAL: Normal range of motion.    Assessment and Plan:  1) Annual gynecologic examination with pap smear:  Will follow up results of pap smear and manage accordingly. STI screening desired No.  Routine preventative health maintenance measures emphasized. Reviewed perimenopausal symptoms and management.   1. Well woman exam with routine gynecological exam (Primary) - MM 3D SCREENING MAMMOGRAM BILATERAL BREAST; Future - Cytology - PAP( [facility]) - Hepatitis C antibody - Declined referral for CRC screening today  2. Diabetes mellitus without complication (HCC) Needs diabetes supply  She is currently using Lantus  and aspart sliding scale Has not been taking medications as planned after admission for DKA Helped patient sign up for PCP visit today in the office - Insulin  Aspart FlexPen (NOVOLOG ) 100 UNIT/ML; Inject 5 Units into the skin 3 (three) times daily.  Dispense: 15 mL; Refill: 3 - insulin  glargine (LANTUS ) 100 UNIT/ML Solostar Pen; Inject 20 Units into the skin at bedtime.  Dispense: 6 mL; Refill: 3 - Insulin  Pen Needle (BD PEN NEEDLE NANO 2ND GEN) 32G X 4 MM MISC; 1 Pen by Does not apply route daily.  Dispense: 90 each; Refill: 0 - Hemoglobin A1c  3. Anemia, unspecified type - CBC  4. Fibroids - Concerns about posterior mass that does to appear to be possibly her fibroid but needs further characterization - Declined CRC screening  - with regards to her spontaneous orgasm- central causes are unlikely givne her normal MR brain-- this might need more work up but will start with US  to better characterize your fibroids and then  consider MRI spine to r/o peripheral lesion. Although when we talked she equates orgasms with discharge so she might be complaining of vaginal discharge as opposed to orgasms.  - US  PELVIC COMPLETE WITH TRANSVAGINAL; Future  5. Trichomonas infection Trich on pap, needs treatment - metroNIDAZOLE  (FLAGYL ) 500 MG tablet; Take 1 tablet (500 mg total) by mouth 2 (two) times daily.  Dispense: 14 tablet; Refill: 0  6. Primary hypertension Did not take her BP meds today Encouraged to take and will have appointment with PCP to recheck and optimize BP  Please refer to After Visit Summary for other counseling recommendations.   Return in about 2 months (around 06/11/2024) for Follow up fibroids.  Future Appointments  Date Time Provider Department Center  04/19/2024  1:00 PM [facility]  [facility]  [facility]  05/07/2024 10:40 AM Everlene Parris LABOR, MD [facility] [address]  05/11/2024 11:00 AM [facility]    Suzen Maryan Masters, MD, MPH, ABFM Attending Physician  Center for [facility] "

## 2024-04-15 LAB — CBC
Hematocrit: 36.7 % (ref 34.0–46.6)
Hemoglobin: 11.3 g/dL (ref 11.1–15.9)
MCH: 28.1 pg (ref 26.6–33.0)
MCHC: 30.8 g/dL — ABNORMAL LOW (ref 31.5–35.7)
MCV: 91 fL (ref 79–97)
Platelets: 313 x10E3/uL (ref 150–450)
RBC: 4.02 x10E6/uL (ref 3.77–5.28)
RDW: 15.7 % — ABNORMAL HIGH (ref 11.7–15.4)
WBC: 7.1 x10E3/uL (ref 3.4–10.8)

## 2024-04-15 LAB — HEMOGLOBIN A1C
Est. average glucose Bld gHb Est-mCnc: >398 mg/dL
Hgb A1c MFr Bld: >15.5 % — ABNORMAL HIGH (ref 4.8–5.6)

## 2024-04-15 LAB — HEPATITIS C ANTIBODY: Hep C Virus Ab: NONREACTIVE

## 2024-04-17 ENCOUNTER — Ambulatory Visit: Payer: Self-pay | Admitting: Family Medicine

## 2024-04-17 LAB — CYTOLOGY - PAP
Adequacy: ABSENT
Comment: NEGATIVE
Diagnosis: NEGATIVE
High risk HPV: NEGATIVE

## 2024-04-17 MED ORDER — METRONIDAZOLE 500 MG PO TABS: 500.0000 mg | ORAL_TABLET | Freq: Two times a day (BID) | ORAL | 0 refills | Status: AC

## 2024-04-18 NOTE — Telephone Encounter (Signed)
-----   Message from Suzen Masters, MD sent at 04/17/2024  9:09 PM EST ----- Pap showed normal pathology but was positive for trichomonas -- I sent in metronidazole .  HA1C- was very abnormal, she needs to restart her insulin  which we discussed at the visit. She is at risk of DKA again if she does not take her insulin .

## 2024-04-18 NOTE — Telephone Encounter (Signed)
 Attempted to call patient to go over results, left voicemail for pt to call back.

## 2024-04-19 ENCOUNTER — Ambulatory Visit
Admission: RE | Admit: 2024-04-19 | Discharge: 2024-04-19 | Disposition: A | Discharge status: Home / Self Care | Source: Ambulatory Visit | Attending: Family Medicine | Admitting: Family Medicine

## 2024-04-19 DIAGNOSIS — D219 Benign neoplasm of connective and other soft tissue, unspecified: Secondary | ICD-10-CM | POA: Insufficient documentation

## 2024-05-07 ENCOUNTER — Ambulatory Visit

## 2024-05-11 ENCOUNTER — Encounter

## 2024-05-28 ENCOUNTER — Ambulatory Visit: Admitting: Internal Medicine
